# Patient Record
Sex: Female | Born: 1960 | Race: White | Hispanic: No | Marital: Married | Smoking: Former smoker
Health system: Southern US, Community
[De-identification: ages and names within clinical notes are randomized; demographics above are authoritative.]

## PROBLEM LIST (undated history)

## (undated) DIAGNOSIS — G40909 Epilepsy, unspecified, not intractable, without status epilepticus: Secondary | ICD-10-CM

## (undated) DIAGNOSIS — N83209 Unspecified ovarian cyst, unspecified side: Secondary | ICD-10-CM

## (undated) DIAGNOSIS — F419 Anxiety disorder, unspecified: Secondary | ICD-10-CM

## (undated) DIAGNOSIS — E039 Hypothyroidism, unspecified: Secondary | ICD-10-CM

## (undated) DIAGNOSIS — K743 Primary biliary cirrhosis: Secondary | ICD-10-CM

## (undated) HISTORY — DX: Epilepsy, unspecified, not intractable, without status epilepticus: G40.909

## (undated) HISTORY — PX: OVARY SURGERY: SHX727

---

## 2004-01-19 ENCOUNTER — Ambulatory Visit: Payer: Self-pay | Admitting: Obstetrics and Gynecology

## 2009-03-10 HISTORY — PX: BREAST CYST ASPIRATION: SHX578

## 2009-10-09 ENCOUNTER — Emergency Department: Payer: Self-pay | Admitting: Emergency Medicine

## 2009-11-09 ENCOUNTER — Ambulatory Visit: Payer: Self-pay | Admitting: Obstetrics and Gynecology

## 2010-03-10 HISTORY — PX: BREAST BIOPSY: SHX20

## 2010-04-17 ENCOUNTER — Ambulatory Visit: Payer: Self-pay | Admitting: Obstetrics and Gynecology

## 2010-07-30 ENCOUNTER — Ambulatory Visit: Payer: Self-pay | Admitting: Surgery

## 2010-08-15 ENCOUNTER — Ambulatory Visit: Payer: Self-pay | Admitting: Surgery

## 2010-08-16 LAB — PATHOLOGY REPORT

## 2010-12-02 ENCOUNTER — Ambulatory Visit: Payer: Self-pay | Admitting: Obstetrics and Gynecology

## 2011-01-15 ENCOUNTER — Ambulatory Visit: Payer: Self-pay | Admitting: Obstetrics and Gynecology

## 2011-12-03 ENCOUNTER — Ambulatory Visit: Payer: Self-pay | Admitting: Obstetrics and Gynecology

## 2012-05-31 ENCOUNTER — Ambulatory Visit: Payer: Self-pay | Admitting: Gastroenterology

## 2012-06-01 LAB — PATHOLOGY REPORT

## 2012-11-05 IMAGING — US ULTRASOUND LEFT BREAST
1 series · 17 of 21 positions shown · non-contrast
Comparison: none

REASON FOR EXAM: L 2 CYSTS  10 AND 12 OCLOCK
COMMENTS:

PROCEDURE:     US  - US BREAST LEFT  - April 17, 2010  [DATE]
RESULT:
COMPARISONS: 09-02-05, 09-07-06, 09-14-07, 10-20-08 from [REDACTED]; 11-09-09 from [HOSPITAL].

[Series 1: ultrasound left breast · 17 of 21 slices shown]
[im 1/21]
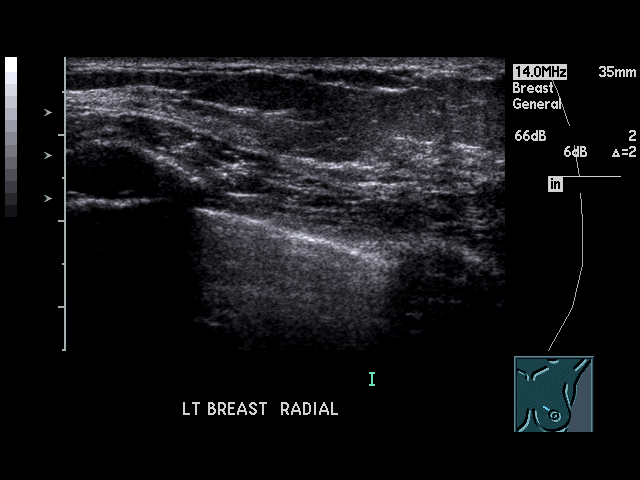
[im 2/21]
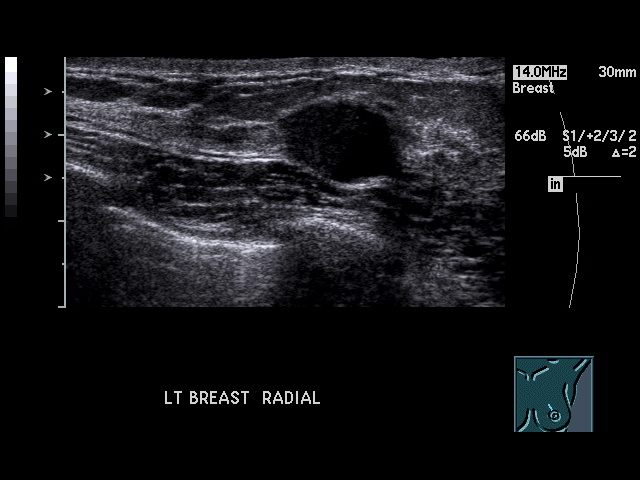
[im 4/21]
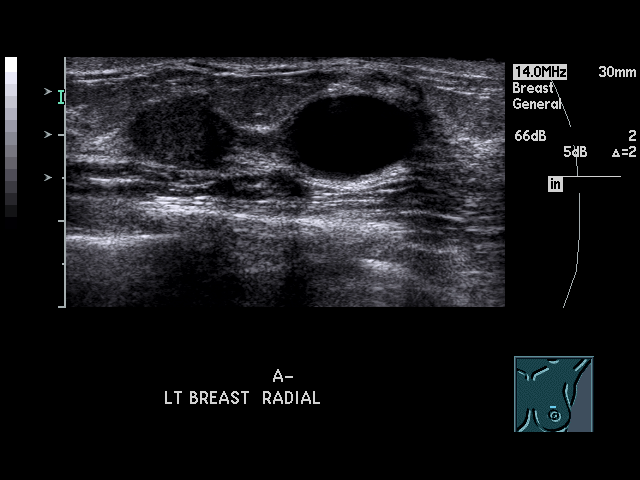
[im 5/21]
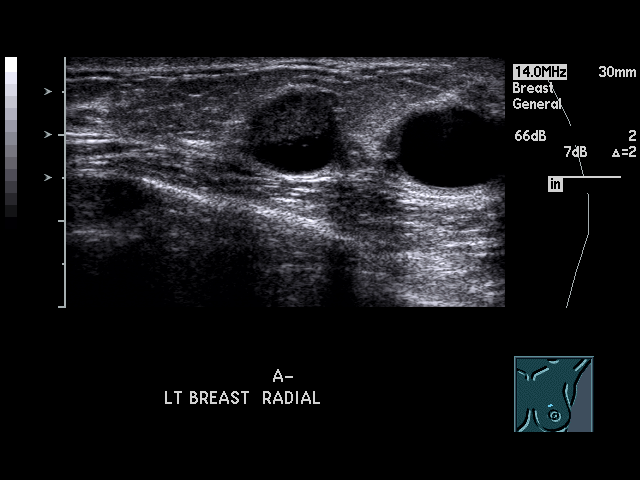
[im 6/21]
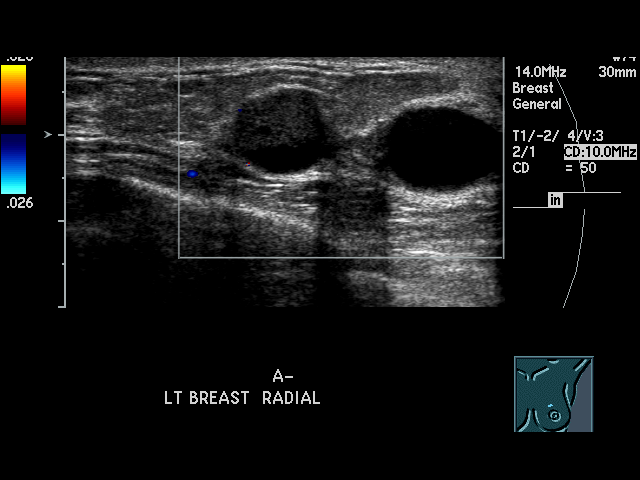
[im 7/21]
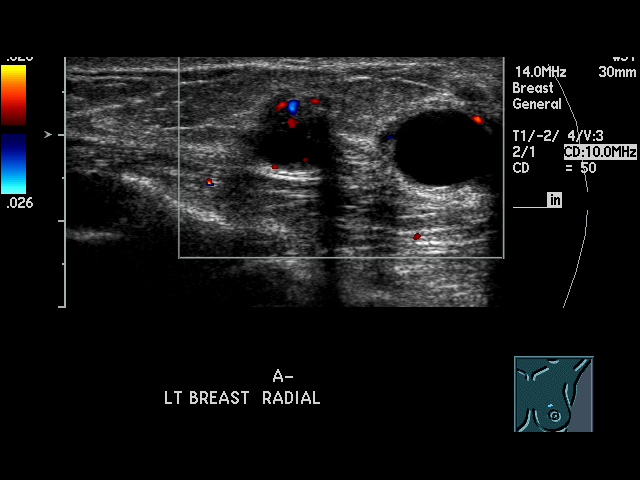
[im 9/21]
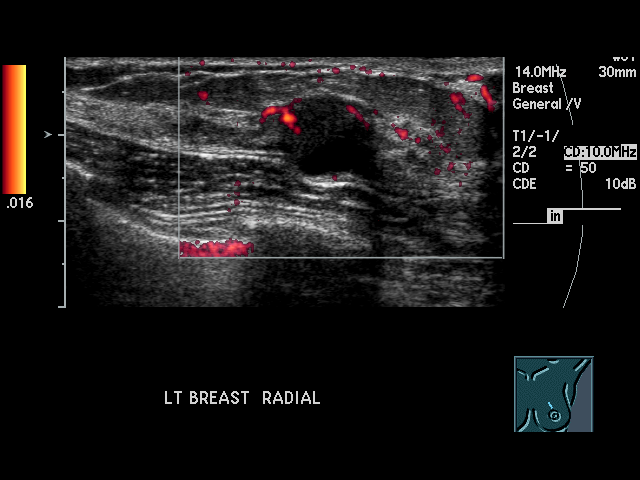
[im 10/21]
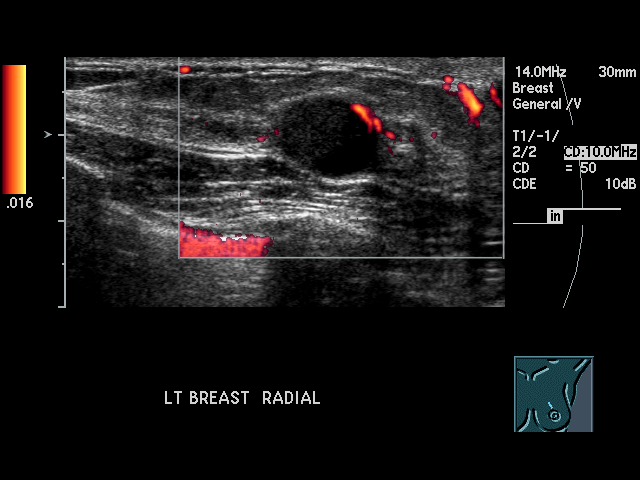
[im 11/21]
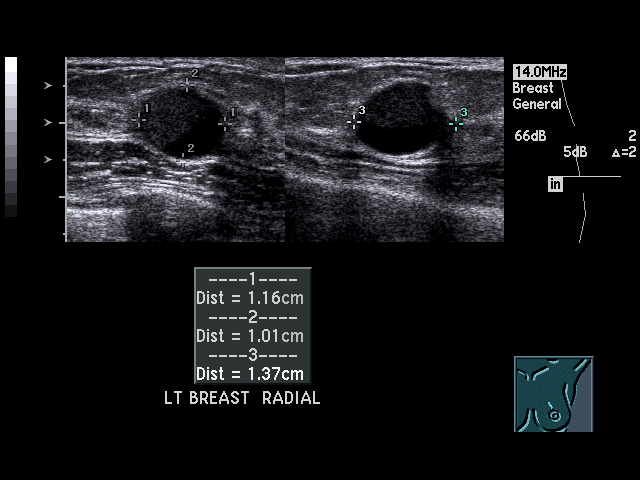
[im 12/21]
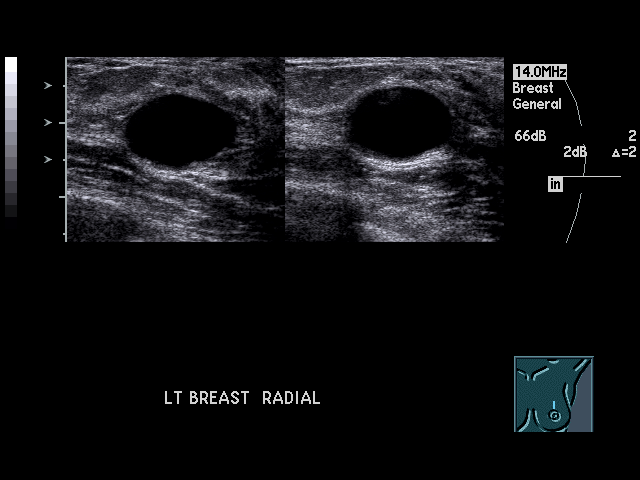
[im 13/21]
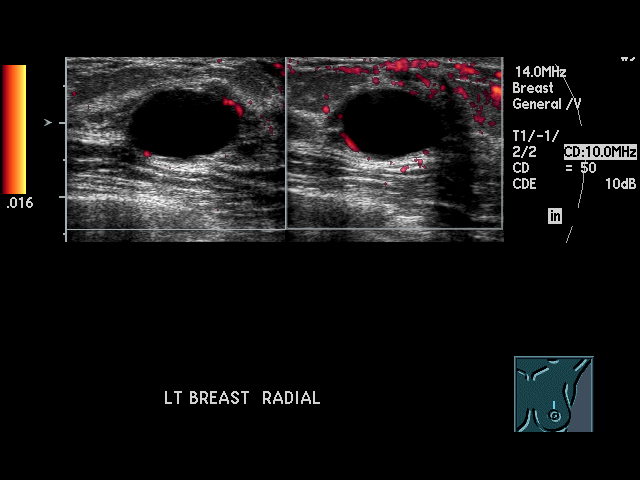
[im 15/21]
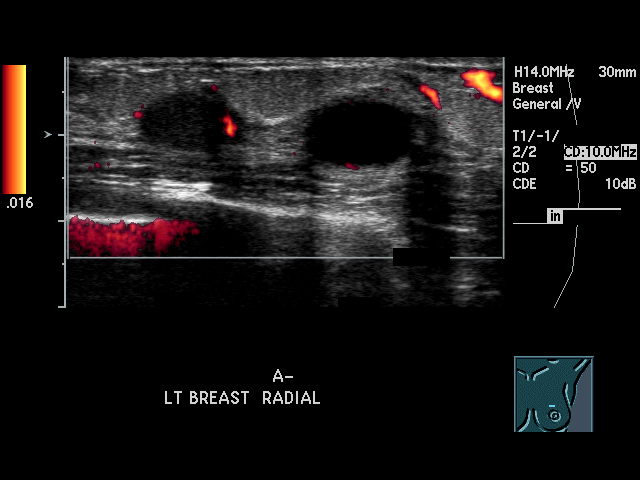
[im 16/21]
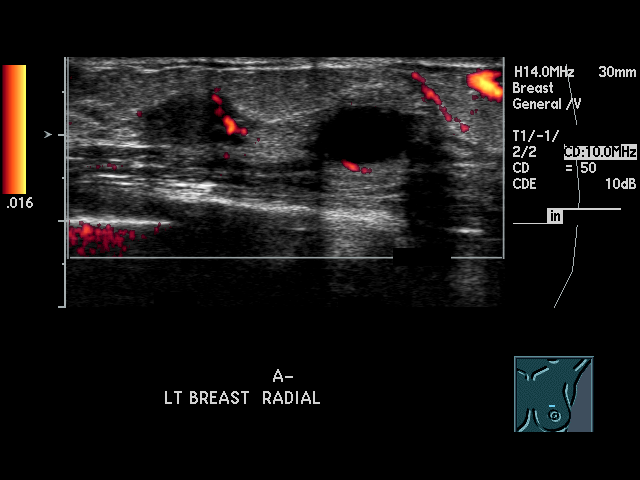
[im 17/21]
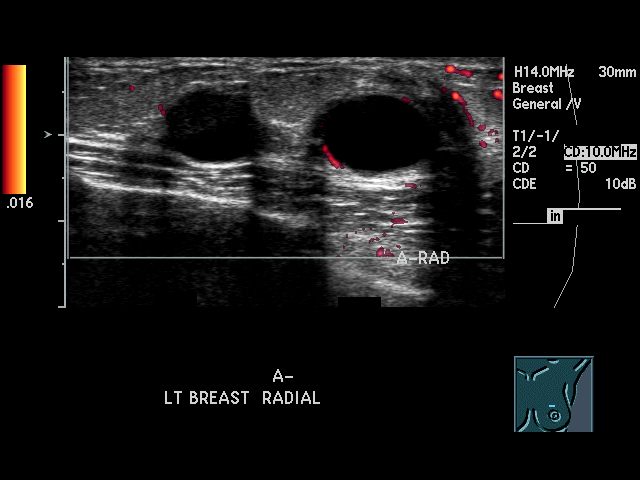
[im 18/21]
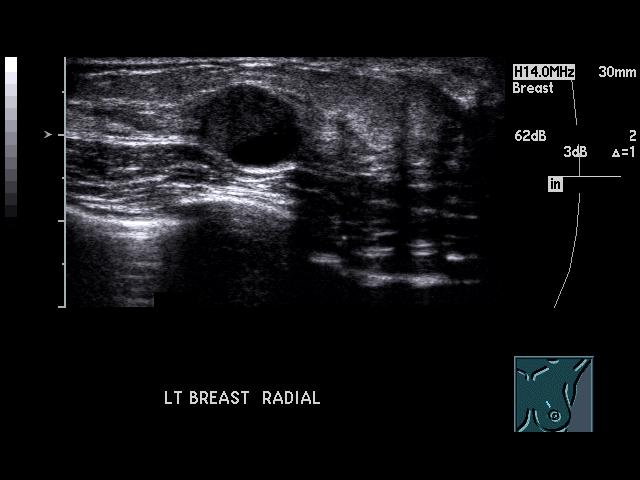
[im 20/21]
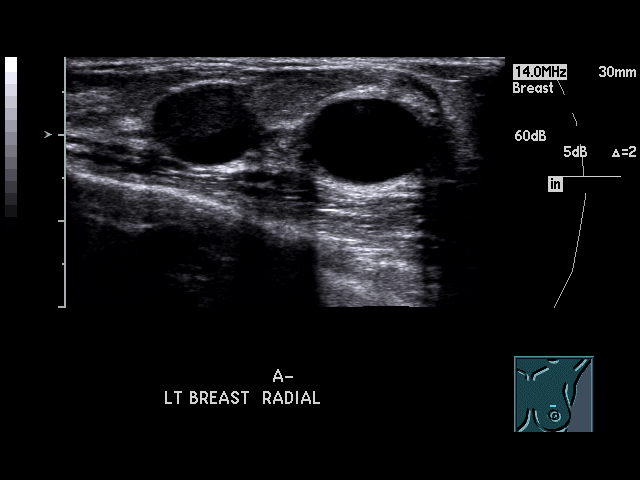
[im 21/21]
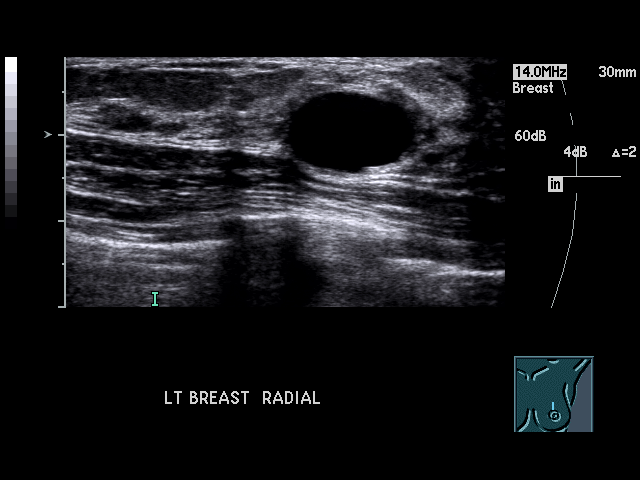

[17 of 21 positions shown; findings below may reference images not displayed]

FINDINGS: Unilateral left breast mammogram demonstrates an extremely dense
fibroglandular pattern limiting evaluation. There is no dominant mass,
architectural distortion or clusters of suspicious microcalcifications.

Further evaluation was performed with real time sonography of the left
breast from the 10 o'clock position to the 12 o'clock position.  At the 12
o'clock position there is a 1.5 x 1.1 x 1.4 cm anechoic mass with increased
through transmission and an imperceptible wall most consistent with a cyst.
Adjacently, at the 11 o'clock position there is a 1.2 x 1.0 x 1.4 cm complex
cystic mass with a mural nodule demonstrating a small amount of internal
Doppler flow.  There is no other solid or cystic mass.
IMPRESSION: 1.Complex cystic mass at the 11 o 'clock position in the left breast. Tissue
diagnosis is recommended.

BI-RADS-Category PE-Coderately Suspicious Abnormality

Lesion with an indeterminate suspicion of malignancy.

## 2012-12-03 ENCOUNTER — Ambulatory Visit: Payer: Self-pay | Admitting: Obstetrics and Gynecology

## 2013-12-19 ENCOUNTER — Ambulatory Visit: Payer: Self-pay | Admitting: Obstetrics and Gynecology

## 2014-08-15 ENCOUNTER — Other Ambulatory Visit: Payer: Self-pay | Admitting: Obstetrics and Gynecology

## 2014-08-15 DIAGNOSIS — N6315 Unspecified lump in the right breast, overlapping quadrants: Secondary | ICD-10-CM

## 2014-08-15 DIAGNOSIS — N6310 Unspecified lump in the right breast, unspecified quadrant: Principal | ICD-10-CM

## 2014-08-17 ENCOUNTER — Ambulatory Visit
Admission: RE | Admit: 2014-08-17 | Discharge: 2014-08-17 | Disposition: A | Discharge status: Home / Self Care | Payer: Commercial Managed Care - PPO | Source: Ambulatory Visit | Attending: Obstetrics and Gynecology | Admitting: Obstetrics and Gynecology

## 2014-08-17 DIAGNOSIS — N6310 Unspecified lump in the right breast, unspecified quadrant: Principal | ICD-10-CM

## 2014-08-17 DIAGNOSIS — N6315 Unspecified lump in the right breast, overlapping quadrants: Secondary | ICD-10-CM

## 2014-08-17 DIAGNOSIS — R928 Other abnormal and inconclusive findings on diagnostic imaging of breast: Secondary | ICD-10-CM | POA: Diagnosis not present

## 2014-08-17 DIAGNOSIS — N63 Unspecified lump in breast: Secondary | ICD-10-CM | POA: Diagnosis present

## 2014-08-18 ENCOUNTER — Ambulatory Visit: Payer: Self-pay

## 2014-08-18 ENCOUNTER — Other Ambulatory Visit: Payer: Self-pay

## 2014-08-22 ENCOUNTER — Ambulatory Visit: Payer: Self-pay

## 2014-10-13 ENCOUNTER — Other Ambulatory Visit: Payer: Self-pay | Admitting: Obstetrics and Gynecology

## 2014-10-13 ENCOUNTER — Ambulatory Visit
Admission: RE | Admit: 2014-10-13 | Discharge: 2014-10-13 | Disposition: A | Discharge status: Home / Self Care | Payer: Commercial Managed Care - PPO | Source: Ambulatory Visit | Attending: Obstetrics and Gynecology | Admitting: Obstetrics and Gynecology

## 2014-10-13 DIAGNOSIS — Z1231 Encounter for screening mammogram for malignant neoplasm of breast: Secondary | ICD-10-CM | POA: Insufficient documentation

## 2015-09-10 ENCOUNTER — Other Ambulatory Visit: Payer: Self-pay | Admitting: Obstetrics and Gynecology

## 2015-09-10 DIAGNOSIS — Z1231 Encounter for screening mammogram for malignant neoplasm of breast: Secondary | ICD-10-CM

## 2015-10-10 ENCOUNTER — Ambulatory Visit
Admission: RE | Admit: 2015-10-10 | Discharge: 2015-10-10 | Disposition: A | Discharge status: Home / Self Care | Payer: Commercial Managed Care - PPO | Source: Ambulatory Visit | Attending: Obstetrics and Gynecology | Admitting: Obstetrics and Gynecology

## 2015-10-10 ENCOUNTER — Other Ambulatory Visit: Payer: Self-pay | Admitting: Obstetrics and Gynecology

## 2015-10-10 DIAGNOSIS — Z1231 Encounter for screening mammogram for malignant neoplasm of breast: Secondary | ICD-10-CM

## 2015-10-15 ENCOUNTER — Ambulatory Visit: Payer: Commercial Managed Care - PPO

## 2016-09-26 ENCOUNTER — Other Ambulatory Visit: Payer: Self-pay | Admitting: Obstetrics and Gynecology

## 2016-09-26 DIAGNOSIS — Z1231 Encounter for screening mammogram for malignant neoplasm of breast: Secondary | ICD-10-CM

## 2016-10-02 ENCOUNTER — Emergency Department: Payer: Commercial Managed Care - PPO

## 2016-10-02 ENCOUNTER — Encounter: Payer: Self-pay | Admitting: Emergency Medicine

## 2016-10-02 ENCOUNTER — Emergency Department
Admission: EM | Admit: 2016-10-02 | Discharge: 2016-10-02 | Disposition: A | Discharge status: Home / Self Care | Payer: Commercial Managed Care - PPO | Attending: Emergency Medicine | Admitting: Emergency Medicine

## 2016-10-02 DIAGNOSIS — R569 Unspecified convulsions: Secondary | ICD-10-CM | POA: Insufficient documentation

## 2016-10-02 DIAGNOSIS — R410 Disorientation, unspecified: Secondary | ICD-10-CM | POA: Diagnosis present

## 2016-10-02 DIAGNOSIS — Z87891 Personal history of nicotine dependence: Secondary | ICD-10-CM | POA: Insufficient documentation

## 2016-10-02 LAB — CBC
HCT: 38.8 % (ref 35.0–47.0)
Hemoglobin: 13.4 g/dL (ref 12.0–16.0)
MCH: 31.3 pg (ref 26.0–34.0)
MCHC: 34.6 g/dL (ref 32.0–36.0)
MCV: 90.7 fL (ref 80.0–100.0)
Platelets: 256 10*3/uL (ref 150–440)
RBC: 4.28 MIL/uL (ref 3.80–5.20)
RDW: 13.7 % (ref 11.5–14.5)
WBC: 11.4 10*3/uL — ABNORMAL HIGH (ref 3.6–11.0)

## 2016-10-02 LAB — HEPATIC FUNCTION PANEL
ALT: 91 U/L — AB (ref 14–54)
AST: 53 U/L — ABNORMAL HIGH (ref 15–41)
Albumin: 4.2 g/dL (ref 3.5–5.0)
Alkaline Phosphatase: 417 U/L — ABNORMAL HIGH (ref 38–126)
BILIRUBIN TOTAL: 0.8 mg/dL (ref 0.3–1.2)
Bilirubin, Direct: 0.1 mg/dL (ref 0.1–0.5)
Indirect Bilirubin: 0.7 mg/dL (ref 0.3–0.9)
Total Protein: 7.9 g/dL (ref 6.5–8.1)

## 2016-10-02 LAB — HCG, QUANTITATIVE, PREGNANCY: hCG, Beta Chain, Quant, S: 1 m[IU]/mL (ref <5)

## 2016-10-02 LAB — BASIC METABOLIC PANEL
Anion gap: 11 (ref 5–15)
BUN: 11 mg/dL (ref 6–20)
CHLORIDE: 103 mmol/L (ref 101–111)
CO2: 25 mmol/L (ref 22–32)
Calcium: 9.4 mg/dL (ref 8.9–10.3)
Creatinine, Ser: 0.84 mg/dL (ref 0.44–1.00)
GFR calc non Af Amer: >60 mL/min (ref >60)
Glucose, Bld: 105 mg/dL — ABNORMAL HIGH (ref 65–99)
Potassium: 3.5 mmol/L (ref 3.5–5.1)
Sodium: 139 mmol/L (ref 135–145)

## 2016-10-02 LAB — ETHANOL: Alcohol, Ethyl (B): <5 mg/dL (ref <5)

## 2016-10-02 NOTE — ED Triage Notes (Signed)
Pt via pov from work. She began to fall on the way to the triage room. Pt was prevented from falling and was unresponsive for a few seconds. When pt was brought to ED room 3, she began to fall again. During triage, she became unresponsive with eyes open x 3, for a few seconds each time. Pt denies pain, states she is "just very tired."

## 2016-10-02 NOTE — Discharge Instructions (Signed)
Fortunately today your CT scan in your blood work were reassuring. It is possible that you have had seizures but according to our neurology consultant he does not recommend that you begin seizure medicine today. Please keep your follow-up with your neurologist tomorrow as scheduled and return to the emergency department for any concerns.  It was a pleasure to take care of you today, and thank you for coming to our emergency department.  If you have any questions or concerns before leaving please ask the nurse to grab me and I'm more than happy to go through your aftercare instructions again.  If you were prescribed any opioid pain medication today such as Norco, Vicodin, Percocet, morphine, hydrocodone, or oxycodone please make sure you do not drive when you are taking this medication as it can alter your ability to drive safely.  If you have any concerns once you are home that you are not improving or are in fact getting worse before you can make it to your follow-up appointment, please do not hesitate to call 911 and come back for further evaluation.  Darel Hong MD  Results for orders placed or performed during the hospital encounter of 36/64/40  Basic metabolic panel  Result Value Ref Range   Sodium 139 135 - 145 mmol/L   Potassium 3.5 3.5 - 5.1 mmol/L   Chloride 103 101 - 111 mmol/L   CO2 25 22 - 32 mmol/L   Glucose, Bld 105 (H) 65 - 99 mg/dL   BUN 11 6 - 20 mg/dL   Creatinine, Ser 0.84 0.44 - 1.00 mg/dL   Calcium 9.4 8.9 - 10.3 mg/dL   GFR calc non Af Amer >60 >60 mL/min   GFR calc Af Amer >60 >60 mL/min   Anion gap 11 5 - 15  CBC  Result Value Ref Range   WBC 11.4 (H) 3.6 - 11.0 K/uL   RBC 4.28 3.80 - 5.20 MIL/uL   Hemoglobin 13.4 12.0 - 16.0 g/dL   HCT 38.8 35.0 - 47.0 %   MCV 90.7 80.0 - 100.0 fL   MCH 31.3 26.0 - 34.0 pg   MCHC 34.6 32.0 - 36.0 g/dL   RDW 13.7 11.5 - 14.5 %   Platelets 256 150 - 440 K/uL  Hepatic function panel  Result Value Ref Range   Total Protein  7.9 6.5 - 8.1 g/dL   Albumin 4.2 3.5 - 5.0 g/dL   AST 53 (H) 15 - 41 U/L   ALT 91 (H) 14 - 54 U/L   Alkaline Phosphatase 417 (H) 38 - 126 U/L   Total Bilirubin 0.8 0.3 - 1.2 mg/dL   Bilirubin, Direct 0.1 0.1 - 0.5 mg/dL   Indirect Bilirubin 0.7 0.3 - 0.9 mg/dL  hCG, quantitative, pregnancy  Result Value Ref Range   hCG, Beta Chain, Quant, S 1 <5 mIU/mL  Ethanol  Result Value Ref Range   Alcohol, Ethyl (B) <5 <5 mg/dL   Ct Head Wo Contrast  Result Date: 10/02/2016 CLINICAL DATA:  New onset seizure EXAM: CT HEAD WITHOUT CONTRAST TECHNIQUE: Contiguous axial images were obtained from the base of the skull through the vertex without intravenous contrast. COMPARISON:  None. FINDINGS: Brain: No evidence of acute infarction, hemorrhage, hydrocephalus, extra-axial collection or mass lesion/mass effect. Vascular: No hyperdense vessel or unexpected calcification. Skull: Negative Sinuses/Orbits: Negative Other: None IMPRESSION: Negative CT head Electronically Signed   By: Franchot Gallo M.D.   On: 10/02/2016 13:24

## 2016-10-02 NOTE — ED Provider Notes (Signed)
[facility] Emergency Department Provider Note  ____________________________________________   First MD Initiated Contact with Patient 10/02/16 1259     (approximate)  I have reviewed the triage vital signs and the nursing notes.   HISTORY  Chief Complaint Loss of Consciousness    HPI Margaret [name] is a 56 y.o. female who self presents to the emergency department with increasing frequency of episodes of confusion. It all began about a year ago when she's had them nearly every day since. She said that once to twice a day she will have an episode where she "loses time". When other people are around her this issue becomes unresponsive and then when she comes back to reality she does not remember what is happening. She has never fallen. She has no pain. No chest pain or shortness of breath. No headache. She notes no palpitations. She has never had any weakness or numbness. She's never lost bowel or bladder continence. She has follow-up scheduled with neurology tomorrow however today she had 3 events and she wanted to come to the emergency department for sooner evaluation.   History reviewed. No pertinent past medical history.  There are no active problems to display for this patient.   Past Surgical History:  Procedure Laterality Date  . BREAST BIOPSY Left 2012   NEG  . BREAST CYST ASPIRATION Left 2011    Prior to Admission medications   Medication Sig Start Date End Date Taking? Authorizing Provider  SYNTHROID 88 MCG tablet Take 88 mcg by mouth daily. 06/30/16  Yes [provider]    Allergies Patient has no known allergies.  Family History  Problem Relation Age of Onset  . Breast cancer Neg Hx     Social History Social History  Substance Use Topics  . Smoking status: Former Research scientist (life sciences)  . Smokeless tobacco: Never Used  . Alcohol use 2.4 oz/week    4 Glasses of wine per week    Review of Systems Constitutional: No  fever/chills Eyes: No visual changes. ENT: No sore throat. Cardiovascular: Denies chest pain. Respiratory: Denies shortness of breath. Gastrointestinal: No abdominal pain.  No nausea, no vomiting.  No diarrhea.  No constipation. Genitourinary: Negative for dysuria. Musculoskeletal: Negative for back pain. Skin: Negative for rash. Neurological: Negative for headaches, focal weakness or numbness.   ____________________________________________   PHYSICAL EXAM:  VITAL SIGNS: ED Triage Vitals [10/02/16 1258]  Enc Vitals Group     BP (!) 160/84     Pulse Rate 99     Resp 15     Temp 99.1 F (37.3 C)     Temp Source Oral     SpO2 97 %     Weight 121 lb (54.9 kg)     Height 5\' 2"  (1.575 m)     Head Circumference      Peak Flow      Pain Score      Pain Loc      Pain Edu?      Excl. in [place]?     Constitutional: Alert and oriented 4 pleasant cooperative speaks full clear sentences Eyes: PERRL EOMI. Head: Atraumatic. Nose: No congestion/rhinnorhea. Mouth/Throat: No trismus Neck: No stridor.   Cardiovascular: Normal rate, regular rhythm. Grossly normal heart sounds.  Good peripheral circulation. Respiratory: Normal respiratory effort.  No retractions. Lungs CTAB and moving good air Gastrointestinal: Soft nontender Musculoskeletal: No lower extremity edema   Neurologic:  Normal speech and language.  No pronator drift 5 out of 5  grips biceps triceps hip flexion and hip extension plantar flexion dorsiflexion sensation intact to light touch throughout ambulance stating he Skin:  Skin is warm, dry and intact. No rash noted. Psychiatric: Mood and affect are normal. Speech and behavior are normal.    ____________________________________________   DIFFERENTIAL includes but not limited to  Cardiac arrhythmia, conversion disorder, absence seizure   LABS (all labs ordered are listed, but only abnormal results are displayed)  Labs Reviewed  BASIC METABOLIC PANEL - Abnormal;  Notable for the following:       Result Value   Glucose, Bld 105 (*)    All other components within normal limits  CBC - Abnormal; Notable for the following:    WBC 11.4 (*)    All other components within normal limits  HEPATIC FUNCTION PANEL - Abnormal; Notable for the following:    AST 53 (*)    ALT 91 (*)    Alkaline Phosphatase 417 (*)    All other components within normal limits  HCG, QUANTITATIVE, PREGNANCY  ETHANOL  CBG MONITORING, ED    Not pregnant and blood work unremarkable __________________________________________  EKG  ED ECG REPORT I, Darel Hong, the attending physician, personally viewed and interpreted this ECG.  Date: 10/02/2016 Rate: 97 Rhythm: normal sinus rhythm QRS Axis: normal Intervals: normal ST/T Wave abnormalities: normal Narrative Interpretation: unremarkable  ____________________________________________  RADIOLOGY  Head CT with no acute disease ____________________________________________   PROCEDURES  Procedure(s) performed: no  Procedures  Critical Care performed: no  Observation: no ____________________________________________   INITIAL IMPRESSION / ASSESSMENT AND PLAN / ED COURSE  Pertinent labs & imaging results that were available during my care of the patient were reviewed by me and considered in my medical decision making (see chart for details).  On arrival the patient is very well-appearing with a normal neurological exam although while talking to me she had a brief episode where she stared off into the distance and was unresponsive lasting about 10-15 seconds. She was subsequently amnestic to the event. This occurred while she was on monitor she had no arrhythmia. Concern for absence seizure versus conversion disorder. Labs and head CT are pending.     ----------------------------------------- 1:34 PM on 10/02/2016 -----------------------------------------  Fortunately the patient's CT scan is reassuring.  She already has neurology follow-up scheduled tomorrow with Dr. Melrose Nakayama, however given the increasing severity of her symptoms and we'll touch base with her specialist on call now for possible antiepileptic recommendations. Regardless I anticipate that the patient will likely be able to go home today. ____________________________________________  I discussed the case with the specialist on call who evaluated the patient and feels that these may not actually be seizures but does recommend an outpatient EEG and MRI. He recommends against initiation of antiepileptic medications. At this point the patient is medically stable for outpatient management.  FINAL CLINICAL IMPRESSION(S) / ED DIAGNOSES  Final diagnoses:  Seizure ([place])      NEW MEDICATIONS STARTED DURING THIS VISIT:  Discharge Medication List as of 10/02/2016  3:38 PM       Note:  This document was prepared using Dragon voice recognition software and may include unintentional dictation errors.     Darel Hong, MD 10/03/16 2158

## 2016-10-03 ENCOUNTER — Observation Stay
Admission: EM | Admit: 2016-10-03 | Discharge: 2016-10-05 | Disposition: A | Discharge status: Home / Self Care | Payer: Commercial Managed Care - PPO | Attending: Internal Medicine | Admitting: Internal Medicine

## 2016-10-03 ENCOUNTER — Encounter: Payer: Self-pay | Admitting: Emergency Medicine

## 2016-10-03 ENCOUNTER — Emergency Department: Payer: Commercial Managed Care - PPO

## 2016-10-03 DIAGNOSIS — G40A09 Absence epileptic syndrome, not intractable, without status epilepticus: Secondary | ICD-10-CM | POA: Diagnosis present

## 2016-10-03 DIAGNOSIS — R079 Chest pain, unspecified: Secondary | ICD-10-CM | POA: Diagnosis not present

## 2016-10-03 DIAGNOSIS — Z87891 Personal history of nicotine dependence: Secondary | ICD-10-CM | POA: Diagnosis not present

## 2016-10-03 DIAGNOSIS — F419 Anxiety disorder, unspecified: Secondary | ICD-10-CM | POA: Diagnosis not present

## 2016-10-03 DIAGNOSIS — R569 Unspecified convulsions: Secondary | ICD-10-CM | POA: Insufficient documentation

## 2016-10-03 DIAGNOSIS — R55 Syncope and collapse: Secondary | ICD-10-CM

## 2016-10-03 DIAGNOSIS — E039 Hypothyroidism, unspecified: Secondary | ICD-10-CM | POA: Diagnosis not present

## 2016-10-03 DIAGNOSIS — R930 Abnormal findings on diagnostic imaging of skull and head, not elsewhere classified: Secondary | ICD-10-CM | POA: Diagnosis not present

## 2016-10-03 HISTORY — DX: Hypothyroidism, unspecified: E03.9

## 2016-10-03 HISTORY — DX: Unspecified ovarian cyst, unspecified side: N83.209

## 2016-10-03 LAB — COMPREHENSIVE METABOLIC PANEL
ALT: 76 U/L — AB (ref 14–54)
ANION GAP: 9 (ref 5–15)
AST: 49 U/L — ABNORMAL HIGH (ref 15–41)
Albumin: 4.2 g/dL (ref 3.5–5.0)
Alkaline Phosphatase: 419 U/L — ABNORMAL HIGH (ref 38–126)
BUN: 16 mg/dL (ref 6–20)
CALCIUM: 9.7 mg/dL (ref 8.9–10.3)
CHLORIDE: 102 mmol/L (ref 101–111)
CO2: 27 mmol/L (ref 22–32)
CREATININE: 0.99 mg/dL (ref 0.44–1.00)
Glucose, Bld: 139 mg/dL — ABNORMAL HIGH (ref 65–99)
Potassium: 3.7 mmol/L (ref 3.5–5.1)
SODIUM: 138 mmol/L (ref 135–145)
Total Bilirubin: 0.6 mg/dL (ref 0.3–1.2)
Total Protein: 8.2 g/dL — ABNORMAL HIGH (ref 6.5–8.1)

## 2016-10-03 LAB — CBC WITH DIFFERENTIAL/PLATELET
BASOS PCT: 2 %
Basophils Absolute: 0.2 10*3/uL — ABNORMAL HIGH (ref 0–0.1)
EOS ABS: 0.5 10*3/uL (ref 0–0.7)
EOS PCT: 3 %
HCT: 41.8 % (ref 35.0–47.0)
Hemoglobin: 14.1 g/dL (ref 12.0–16.0)
LYMPHS ABS: 4.2 10*3/uL — AB (ref 1.0–3.6)
Lymphocytes Relative: 30 %
MCH: 30.7 pg (ref 26.0–34.0)
MCHC: 33.8 g/dL (ref 32.0–36.0)
MCV: 90.9 fL (ref 80.0–100.0)
MONOS PCT: 8 %
Monocytes Absolute: 1.1 10*3/uL — ABNORMAL HIGH (ref 0.2–0.9)
Neutro Abs: 7.8 10*3/uL — ABNORMAL HIGH (ref 1.4–6.5)
Neutrophils Relative %: 57 %
Platelets: 288 10*3/uL (ref 150–440)
RBC: 4.6 MIL/uL (ref 3.80–5.20)
RDW: 13.7 % (ref 11.5–14.5)
WBC: 13.8 10*3/uL — AB (ref 3.6–11.0)

## 2016-10-03 LAB — PREGNANCY, URINE: Preg Test, Ur: NEGATIVE

## 2016-10-03 LAB — URINALYSIS, COMPLETE (UACMP) WITH MICROSCOPIC
Bacteria, UA: NONE SEEN
Bilirubin Urine: NEGATIVE
GLUCOSE, UA: NEGATIVE mg/dL
Ketones, ur: NEGATIVE mg/dL
Leukocytes, UA: NEGATIVE
NITRITE: NEGATIVE
PH: 6 (ref 5.0–8.0)
PROTEIN: NEGATIVE mg/dL
SPECIFIC GRAVITY, URINE: 1.017 (ref 1.005–1.030)

## 2016-10-03 LAB — BRAIN NATRIURETIC PEPTIDE: B NATRIURETIC PEPTIDE 5: 14 pg/mL (ref 0.0–100.0)

## 2016-10-03 LAB — TROPONIN I

## 2016-10-03 LAB — LACTIC ACID, PLASMA: Lactic Acid, Venous: 1.3 mmol/L (ref 0.5–1.9)

## 2016-10-03 LAB — LIPASE, BLOOD: LIPASE: 40 U/L (ref 11–51)

## 2016-10-03 MED ORDER — ASPIRIN 81 MG PO CHEW
324.0000 mg | CHEWABLE_TABLET | Freq: Once | ORAL | Status: AC
  Administered 2016-10-03: 324 mg via ORAL
  Filled 2016-10-03: qty 4

## 2016-10-03 NOTE — ED Notes (Signed)
Pt. States feeling "knot" in chest today about 21:00 tonight.  Pt. States ongoing issue for the past year.  Pt. States "I usually take some deep breaths and it goes away".  Pt. States taking an anti-axiety medication today around noon.  Pt. States also taking melatonin before going to bed tonight.

## 2016-10-03 NOTE — ED Notes (Signed)
Pt. Was brought back to room #26 from triage.  Pt. Had a syncope like episode on transport from triage to Room #26.  Pt. Alert and oriented when moved from wheelchair to bed.

## 2016-10-03 NOTE — ED Notes (Addendum)
Shout heard from lobby doors, man with pt seen holding pt up after apparent syncopal episode, report pt c/o chest pain and wearing halter monitor, pt assisted to wheelchair and taken to rm 26.  Man states pt did not fall to ground, he had caught her

## 2016-10-03 NOTE — ED Provider Notes (Signed)
[facility] Emergency Department Provider Note   ____________________________________________   First MD Initiated Contact with Patient 10/03/16 2226     (approximate)  I have reviewed the triage vital signs and the nursing notes.   HISTORY  Chief Complaint Chest Pain   HPI Margaret [name] is a 56 y.o. female who was seen here yesterday for episodes of forgetfulness and forgetting where she was. She went to see neurology today who did not think these were seizures. She was put on a Holter monitor. An hour and a half prior to her arrival she began to feel bad she began to have not feeling in her chest feeling like there was a knot in her chest perhaps that should say she got somewhat nauseated and short of breath. Began to feel very sick and lightheaded. She came in to the hospital on the way here, very weak and lightheaded and actually passed out briefly. She is still having some symptoms of shortness of breath tightness as I understand it in her chest.   History reviewed. No pertinent past medical history.  There are no active problems to display for this patient.   Past Surgical History:  Procedure Laterality Date  . BREAST BIOPSY Left 2012   NEG  . BREAST CYST ASPIRATION Left 2011    Prior to Admission medications   Medication Sig Start Date End Date Taking? Authorizing Provider  SYNTHROID 88 MCG tablet Take 88 mcg by mouth daily. 06/30/16   [provider]    Allergies Patient has no known allergies.  Family History  Problem Relation Age of Onset  . Breast cancer Neg Hx     Social History Social History  Substance Use Topics  . Smoking status: Former Research scientist (life sciences)  . Smokeless tobacco: Never Used  . Alcohol use 2.4 oz/week    4 Glasses of wine per week    Review of Systems  Constitutional: No fever/chills Eyes: No visual changes. ENT: No sore throat. Cardiovascular: See history of present illness. Respiratory: See history of  present illness Gastrointestinal: No abdominal pain.   nausea, no vomiting.  No diarrhea.  No constipation. Genitourinary: Negative for dysuria. Musculoskeletal: Negative for back pain. Skin: Negative for rash. Neurological: Negative for headaches, focal weakness or numbness.   ____________________________________________   PHYSICAL EXAM:  VITAL SIGNS: ED Triage Vitals  Enc Vitals Group     BP 10/03/16 2236 (!) 147/92     Pulse Rate 10/03/16 2231 90     Resp 10/03/16 2231 20     Temp 10/03/16 2236 98.1 F (36.7 C)     Temp Source 10/03/16 2236 Oral     SpO2 10/03/16 2236 96 %     Weight 10/03/16 2227 121 lb (54.9 kg)     Height 10/03/16 2227 5\' 2"  (1.575 m)     Head Circumference --      Peak Flow --      Pain Score 10/03/16 2226 0     Pain Loc --      Pain Edu? --      Excl. in [place]? --     Constitutional: Alert and oriented. Well appearing and in no acute distress. Eyes: Conjunctivae are normal. PERRL. EOMI. Head: Atraumatic. Nose: No congestion/rhinnorhea. Mouth/Throat: Mucous membranes are moist.  Oropharynx non-erythematous. Neck: No stridor.  Cardiovascular: Normal rate, regular rhythm. Grossly normal heart sounds.  Good peripheral circulation. Respiratory: Normal respiratory effort.  No retractions. Lungs CTAB. Gastrointestinal: Soft and nontender. No distention. No  abdominal bruits. No CVA tenderness. Musculoskeletal: No lower extremity tenderness nor edema.  No joint effusions. Neurologic:  Normal speech and language. No gross focal neurologic deficits are appreciated.  Skin:  Skin is warm, dry and intact. No rash noted. Psychiatric: Mood and affect are normal. Speech and behavior are normal.  ____________________________________________   LABS (all labs ordered are listed, but only abnormal results are displayed)  Labs Reviewed  COMPREHENSIVE METABOLIC PANEL - Abnormal; Notable for the following:       Result Value   Glucose, Bld 139 (*)    Total  Protein 8.2 (*)    AST 49 (*)    ALT 76 (*)    Alkaline Phosphatase 419 (*)    All other components within normal limits  CBC WITH DIFFERENTIAL/PLATELET - Abnormal; Notable for the following:    WBC 13.8 (*)    Neutro Abs 7.8 (*)    Lymphs Abs 4.2 (*)    Monocytes Absolute 1.1 (*)    Basophils Absolute 0.2 (*)    All other components within normal limits  LIPASE, BLOOD  TROPONIN I  LACTIC ACID, PLASMA  BRAIN NATRIURETIC PEPTIDE  LACTIC ACID, PLASMA  URINALYSIS, COMPLETE (UACMP) WITH MICROSCOPIC  URINE DRUG SCREEN, QUALITATIVE ([facility] ONLY)  PREGNANCY, URINE   ____________________________________________  EKG  EKG read and interpreted by me shows normal sinus rhythm rate of 80 she has 2 PVCs otherwise nonspecific ST-T wave changes ____________________________________________  RADIOLOGY  Dg Chest Portable 1 View  Result Date: 10/03/2016 CLINICAL DATA:  Chest pain.  Seen yesterday for the same thing. EXAM: PORTABLE CHEST 1 VIEW COMPARISON:  None. FINDINGS: Moderate hyperinflation. The lungs are clear. The pulmonary vasculature is normal. Heart size is normal. Hilar and mediastinal contours are unremarkable. No pleural effusions. No pneumothorax. IMPRESSION: No active disease. Electronically Signed   By: Andreas Newport M.D.   On: 10/03/2016 23:02    ____________________________________________   PROCEDURES  Procedure(s) performed:   Procedures  Critical Care performed:   ____________________________________________   INITIAL IMPRESSION / ASSESSMENT AND PLAN / ED COURSE  Pertinent labs & imaging results that were available during my care of the patient were reviewed by me and considered in my medical decision making (see chart for details).   Patient's labs are normal still she had chest pain shortness of breath and syncope. We will put her in the hospital overnight.     ____________________________________________   FINAL CLINICAL IMPRESSION(S) / ED  DIAGNOSES  Final diagnoses:  Chest pain, unspecified type  Syncope, unspecified syncope type      NEW MEDICATIONS STARTED DURING THIS VISIT:  New Prescriptions   No medications on file     Note:  This document was prepared using Dragon voice recognition software and may include unintentional dictation errors.    Nena Polio, MD 10/03/16 [phone]

## 2016-10-03 NOTE — ED Triage Notes (Signed)
Pt arrived POV with c/o chest "knot". Pt was seen here yesterday for same. Pt is wearing halter monitor at this time. Pt is responsive, answering questions, and in NAD at this time.

## 2016-10-04 ENCOUNTER — Observation Stay: Payer: Commercial Managed Care - PPO

## 2016-10-04 ENCOUNTER — Observation Stay (HOSPITAL_BASED_OUTPATIENT_CLINIC_OR_DEPARTMENT_OTHER)
Admit: 2016-10-04 | Discharge: 2016-10-04 | Disposition: A | Discharge status: Home / Self Care | Payer: Commercial Managed Care - PPO | Attending: Internal Medicine | Admitting: Internal Medicine

## 2016-10-04 DIAGNOSIS — R410 Disorientation, unspecified: Secondary | ICD-10-CM | POA: Diagnosis not present

## 2016-10-04 DIAGNOSIS — R079 Chest pain, unspecified: Secondary | ICD-10-CM

## 2016-10-04 DIAGNOSIS — F419 Anxiety disorder, unspecified: Secondary | ICD-10-CM | POA: Diagnosis present

## 2016-10-04 LAB — ECHOCARDIOGRAM COMPLETE
Height: 62 in
Weight: 1936 oz

## 2016-10-04 LAB — BASIC METABOLIC PANEL
ANION GAP: 6 (ref 5–15)
BUN: 15 mg/dL (ref 6–20)
CHLORIDE: 105 mmol/L (ref 101–111)
CO2: 29 mmol/L (ref 22–32)
CREATININE: 0.83 mg/dL (ref 0.44–1.00)
Calcium: 9.4 mg/dL (ref 8.9–10.3)
GFR calc non Af Amer: >60 mL/min (ref >60)
Glucose, Bld: 110 mg/dL — ABNORMAL HIGH (ref 65–99)
POTASSIUM: 4.1 mmol/L (ref 3.5–5.1)
SODIUM: 140 mmol/L (ref 135–145)

## 2016-10-04 LAB — URINE DRUG SCREEN, QUALITATIVE (ARMC ONLY)
AMPHETAMINES, UR SCREEN: NOT DETECTED
Barbiturates, Ur Screen: NOT DETECTED
Benzodiazepine, Ur Scrn: NOT DETECTED
Cannabinoid 50 Ng, Ur ~~LOC~~: NOT DETECTED
Cocaine Metabolite,Ur ~~LOC~~: NOT DETECTED
MDMA (ECSTASY) UR SCREEN: NOT DETECTED
METHADONE SCREEN, URINE: NOT DETECTED
OPIATE, UR SCREEN: NOT DETECTED
PHENCYCLIDINE (PCP) UR S: NOT DETECTED
Tricyclic, Ur Screen: NOT DETECTED

## 2016-10-04 LAB — TSH: TSH: 1.514 u[IU]/mL (ref 0.350–4.500)

## 2016-10-04 LAB — CBC
HCT: 39.6 % (ref 35.0–47.0)
HEMOGLOBIN: 13.7 g/dL (ref 12.0–16.0)
MCH: 32.1 pg (ref 26.0–34.0)
MCHC: 34.6 g/dL (ref 32.0–36.0)
MCV: 92.9 fL (ref 80.0–100.0)
Platelets: 238 10*3/uL (ref 150–440)
RBC: 4.27 MIL/uL (ref 3.80–5.20)
RDW: 13.6 % (ref 11.5–14.5)
WBC: 12 10*3/uL — AB (ref 3.6–11.0)

## 2016-10-04 LAB — TROPONIN I
Troponin I: <0.03 ng/mL (ref <0.03)
Troponin I: <0.03 ng/mL (ref <0.03)

## 2016-10-04 MED ORDER — ACETAMINOPHEN 650 MG RE SUPP: 650.0000 mg | Freq: Four times a day (QID) | RECTAL | Status: DC | PRN

## 2016-10-04 MED ORDER — ALPRAZOLAM 0.25 MG PO TABS: 0.2500 mg | ORAL_TABLET | Freq: Every day | ORAL | Status: DC | PRN

## 2016-10-04 MED ORDER — LEVOTHYROXINE SODIUM 88 MCG PO TABS
88.0000 ug | ORAL_TABLET | Freq: Every day | ORAL | Status: DC
  Administered 2016-10-04 – 2016-10-05 (×2): 88 ug via ORAL
  Filled 2016-10-04 (×2): qty 1

## 2016-10-04 MED ORDER — ESCITALOPRAM OXALATE 10 MG PO TABS
10.0000 mg | ORAL_TABLET | Freq: Every day | ORAL | Status: DC
  Administered 2016-10-04: 10 mg via ORAL
  Filled 2016-10-04 (×2): qty 1

## 2016-10-04 MED ORDER — ONDANSETRON HCL 4 MG/2ML IJ SOLN: 4.0000 mg | Freq: Four times a day (QID) | INTRAMUSCULAR | Status: DC | PRN

## 2016-10-04 MED ORDER — ONDANSETRON HCL 4 MG PO TABS
4.0000 mg | ORAL_TABLET | Freq: Four times a day (QID) | ORAL | Status: DC | PRN
  Administered 2016-10-04: 4 mg via ORAL
  Filled 2016-10-04: qty 1

## 2016-10-04 MED ORDER — ACETAMINOPHEN 325 MG PO TABS: 650.0000 mg | ORAL_TABLET | Freq: Four times a day (QID) | ORAL | Status: DC | PRN

## 2016-10-04 MED ORDER — LORAZEPAM 2 MG/ML IJ SOLN: 2.0000 mg | Freq: Once | INTRAMUSCULAR | Status: DC | PRN

## 2016-10-04 MED ORDER — LEVETIRACETAM 500 MG PO TABS: 500.0000 mg | ORAL_TABLET | Freq: Two times a day (BID) | ORAL | 2 refills | Status: DC

## 2016-10-04 MED ORDER — SODIUM CHLORIDE 0.9 % IV BOLUS (SEPSIS)
500.0000 mL | Freq: Once | INTRAVENOUS | Status: AC
  Administered 2016-10-04: 500 mL via INTRAVENOUS

## 2016-10-04 MED ORDER — ENOXAPARIN SODIUM 40 MG/0.4ML ~~LOC~~ SOLN
40.0000 mg | SUBCUTANEOUS | Status: DC
  Administered 2016-10-04: 40 mg via SUBCUTANEOUS
  Filled 2016-10-04: qty 0.4

## 2016-10-04 MED ORDER — LEVETIRACETAM 500 MG PO TABS
500.0000 mg | ORAL_TABLET | Freq: Two times a day (BID) | ORAL | Status: DC
  Administered 2016-10-04 – 2016-10-05 (×2): 500 mg via ORAL
  Filled 2016-10-04 (×2): qty 1

## 2016-10-04 MED ORDER — LEVETIRACETAM 500 MG PO TABS: 500.0000 mg | ORAL_TABLET | Freq: Two times a day (BID) | ORAL | Status: DC

## 2016-10-04 MED ORDER — SODIUM CHLORIDE 0.9% FLUSH
3.0000 mL | Freq: Two times a day (BID) | INTRAVENOUS | Status: DC
  Administered 2016-10-04: 3 mL via INTRAVENOUS

## 2016-10-04 NOTE — Progress Notes (Signed)
*  PRELIMINARY RESULTS* Echocardiogram 2D Echocardiogram has been performed.  Margaret [name] 10/04/2016, 10:27 AM 

## 2016-10-04 NOTE — Progress Notes (Signed)
MD made aware of + orthostatic VS 

## 2016-10-04 NOTE — Progress Notes (Signed)
[name] NAME: Margaret [name]    MR#:  [id number]  DATE OF BIRTH:  1960-11-06  SUBJECTIVE:  CHIEF COMPLAINT:   Chief Complaint  Patient presents with  . Chest Pain   -under significant stress and also anxiety at work. Has been ha for almost a year now.    REVIEW OF SYSTEMS:  Review of Systems  Constitutional: Positive for malaise/fatigue. Negative for chills and fever.  HENT: Negative for congestion, ear discharge, hearing loss and nosebleeds.   Eyes: Negative for blurred vision and double vision.  Respiratory: Negative for cough, shortness of breath and wheezing.   Cardiovascular: Negative for chest pain, palpitations and leg swelling.  Gastrointestinal: Negative for abdominal pain, constipation, diarrhea, nausea and vomiting.  Genitourinary: Negative for dysuria.  Musculoskeletal: Negative for myalgias and neck pain.  Neurological: Positive for dizziness. Negative for seizures and headaches.       Staring spell  Psychiatric/Behavioral: The patient is nervous/anxious.     DRUG ALLERGIES:  No Known Allergies  VITALS:  Blood pressure 92/80, pulse 64, temperature 97.7 F (36.5 C), temperature source Oral, resp. rate 18, height 5\' 2"  (1.575 m), weight 54.9 kg (121 lb), SpO2 96 %.  PHYSICAL EXAMINATION:  Physical Exam  GENERAL:  56 y.o.-year-old patient lying in the bed with no acute distress.  EYES: Pupils equal, round, reactive to light and accommodation. No scleral icterus. Extraocular muscles intact.  HEENT: Head atraumatic, normocephalic. Oropharynx and nasopharynx clear.  NECK:  Supple, no jugular venous distention. No thyroid enlargement, no tenderness.  LUNGS: Normal breath sounds bilaterally, no wheezing, rales,rhonchi or crepitation. No use of accessory muscles of respiration.  CARDIOVASCULAR: S1, S2 normal. No murmurs, rubs, or gallops.  ABDOMEN: Soft, nontender, nondistended. Bowel sounds present. No  organomegaly or mass.  EXTREMITIES: No pedal edema, cyanosis, or clubbing.  NEUROLOGIC: Cranial nerves II through XII are intact. Muscle strength 5/5 in all extremities. Sensation intact. Gait not checked.  PSYCHIATRIC: The patient is alert and oriented x 3. Appears anxious SKIN: No obvious rash, lesion, or ulcer.    LABORATORY PANEL:   CBC  Recent Labs Lab 10/04/16 0414  WBC 12.0*  HGB 13.7  HCT 39.6  PLT 238   ------------------------------------------------------------------------------------------------------------------  Chemistries   Recent Labs Lab 10/03/16 2228 10/04/16 0414  NA 138 140  K 3.7 4.1  CL 102 105  CO2 27 29  GLUCOSE 139* 110*  BUN 16 15  CREATININE 0.99 0.83  CALCIUM 9.7 9.4  AST 49*  --   ALT 76*  --   ALKPHOS 419*  --   BILITOT 0.6  --    ------------------------------------------------------------------------------------------------------------------  Cardiac Enzymes  Recent Labs Lab 10/04/16 0414  TROPONINI <0.03   ------------------------------------------------------------------------------------------------------------------  RADIOLOGY:  Ct Head Wo Contrast  Result Date: 10/02/2016 CLINICAL DATA:  New onset seizure EXAM: CT HEAD WITHOUT CONTRAST TECHNIQUE: Contiguous axial images were obtained from the base of the skull through the vertex without intravenous contrast. COMPARISON:  None. FINDINGS: Brain: No evidence of acute infarction, hemorrhage, hydrocephalus, extra-axial collection or mass lesion/mass effect. Vascular: No hyperdense vessel or unexpected calcification. Skull: Negative Sinuses/Orbits: Negative Other: None IMPRESSION: Negative CT head Electronically Signed   By: Franchot Gallo M.D.   On: 10/02/2016 13:24   Dg Chest Portable 1 View  Result Date: 10/03/2016 CLINICAL DATA:  Chest pain.  Seen yesterday for the same thing. EXAM: PORTABLE CHEST 1 VIEW COMPARISON:  None. FINDINGS: Moderate hyperinflation. The lungs are  clear. The pulmonary vasculature is normal. Heart size is normal. Hilar and mediastinal contours are unremarkable. No pleural effusions. No pneumothorax. IMPRESSION: No active disease. Electronically Signed   By: Andreas Newport M.D.   On: 10/03/2016 23:02    EKG:   Orders placed or performed during the hospital encounter of 10/03/16  . ED EKG  . ED EKG    ASSESSMENT AND PLAN:   56 year old female with no significant past medical history presents to hospital after syncopal episode.  #1 syncope/staring spells-has been going on for almost a year now. -Denies any alcohol abuse. Drug screen is negative. -Just started on a Holter monitor as outpatient, normal on cardiac monitoring overnight. -Echo is done and is pending. CT of the head done in the ER was normal. -MRI of the brain and carotid Dopplers pending at this time. -EEG was done as outpatient yesterday did not show any acute findings. -Cardiology consult and neurology consult pending at this time. -Started on Lexapro as outpatient. -await neurology input to see if she was started on any medications for absent seizures -Check orthostatic blood pressure changes. -TSH is pending.  #2 anxiety-on Xanax as needed  #3 hypothyroidism continue Synthroid  Anticipate discharge if tests are negative  All the records are reviewed and case discussed with Care Management/Social Workerr. Management plans discussed with the patient, family and they are in agreement.  CODE STATUS: Full code  TOTAL TIME TAKING CARE OF THIS PATIENT: 38 minutes.   POSSIBLE D/C today, DEPENDING ON CLINICAL CONDITION.   Dequincy Born M.D on 10/04/2016 at 8:57 AM  Between 7am to 6pm - Pager - [phone]  After 6pm go to [email or web address] - password [email or web address] [facility]  Office  [phone]  CC: Primary care physician; Marden Noble, MD

## 2016-10-04 NOTE — Consult Note (Addendum)
Reason for Consult: black out spells  Referring Physician: Dr. Tressia Miners  CC: Black out spells   HPI: Margaret [name] is an 56 y.o. female who present with multiple pisodes lasting 10-15 second "blackouts". She states that she does not lose consciousness, and that she returns to full functionality immediately. As per pt this has been going on for years. She can have multiple a day or go few days without them.  Described as being in a daze. No LOC, shaking episodes, urinary incontinence.   Pt was found to have periods of bradycardia in the 40s. CTH and EEG no acute abnormalities    Past Medical History:  Diagnosis Date  . Hypothyroidism   . Ovarian cyst     Past Surgical History:  Procedure Laterality Date  . BREAST BIOPSY Left 2012   NEG  . BREAST CYST ASPIRATION Left 2011    Family History  Problem Relation Age of Onset  . Diabetes Mother   . CAD Father   . Breast cancer Neg Hx     Social History:  reports that she has quit smoking. She has never used smokeless tobacco. She reports that she drinks about 2.4 oz of alcohol per week . She reports that she does not use drugs.  No Known Allergies  Medications: I have reviewed the patient's current medications.  ROS: History obtained from the patient  General ROS: negative for - chills, fatigue, fever, night sweats, weight gain or weight loss Psychological ROS: anxiety Ophthalmic ROS: negative for - blurry vision, double vision, eye pain or loss of vision ENT ROS: negative for - epistaxis, nasal discharge, oral lesions, sore throat, tinnitus or vertigo Allergy and Immunology ROS: negative for - hives or itchy/watery eyes Hematological and Lymphatic ROS: negative for - bleeding problems, bruising or swollen lymph nodes Endocrine ROS: negative for - galactorrhea, hair pattern changes, polydipsia/polyuria or temperature intolerance Respiratory ROS: negative for - cough, hemoptysis, shortness of breath or  wheezing Cardiovascular ROS: negative for - chest pain, dyspnea on exertion, edema or irregular heartbeat Gastrointestinal ROS: negative for - abdominal pain, diarrhea, hematemesis, nausea/vomiting or stool incontinence Genito-Urinary ROS: negative for - dysuria, hematuria, incontinence or urinary frequency/urgency Musculoskeletal ROS: negative for - joint swelling or muscular weakness Neurological ROS: as noted in HPI Dermatological ROS: negative for rash and skin lesion changes  Physical Examination: Blood pressure 92/80, pulse 64, temperature 97.7 F (36.5 C), temperature source Oral, resp. rate 18, height 5\' 2"  (1.575 m), weight 54.9 kg (121 lb), SpO2 96 %.    Neurological Examination   Mental Status: Alert, oriented, thought content appropriate.  Speech fluent without evidence of aphasia.  Able to follow 3 step commands without difficulty. Cranial Nerves: II: Discs flat bilaterally; Visual fields grossly normal, pupils equal, round, reactive to light and accommodation III,IV, VI: ptosis not present, extra-ocular motions intact bilaterally V,VII: smile symmetric, facial light touch sensation normal bilaterally VIII: hearing normal bilaterally IX,X: gag reflex present XI: bilateral shoulder shrug XII: midline tongue extension Motor: Right : Upper extremity   5/5    Left:     Upper extremity   5/5  Lower extremity   5/5     Lower extremity   5/5 Tone and bulk:normal tone throughout; no atrophy noted Sensory: Pinprick and light touch intact throughout, bilaterally Deep Tendon Reflexes: 2+ and symmetric throughout Plantars: Right: downgoing   Left: downgoing Cerebellar: normal finger-to-nose, normal rapid alternating movements and normal heel-to-shin test Gait: not tested      Laboratory  Studies:   Basic Metabolic Panel:  Recent Labs Lab 10/02/16 1304 10/03/16 2228 10/04/16 0414  NA 139 138 140  K 3.5 3.7 4.1  CL 103 102 105  CO2 25 27 29   GLUCOSE 105* 139* 110*   BUN 11 16 15   CREATININE 0.84 0.99 0.83  CALCIUM 9.4 9.7 9.4    Liver Function Tests:  Recent Labs Lab 10/02/16 1304 10/03/16 2228  AST 53* 49*  ALT 91* 76*  ALKPHOS 417* 419*  BILITOT 0.8 0.6  PROT 7.9 8.2*  ALBUMIN 4.2 4.2    Recent Labs Lab 10/03/16 2228  LIPASE 40   No results for input(s): AMMONIA in the last 168 hours.  CBC:  Recent Labs Lab 10/02/16 1304 10/03/16 2228 10/04/16 0414  WBC 11.4* 13.8* 12.0*  NEUTROABS  --  7.8*  --   HGB 13.4 14.1 13.7  HCT 38.8 41.8 39.6  MCV 90.7 90.9 92.9  PLT 256 288 238    Cardiac Enzymes:  Recent Labs Lab 10/03/16 2228 10/04/16 0414  TROPONINI <0.03 <0.03    BNP: Invalid input(s): POCBNP  CBG: No results for input(s): GLUCAP in the last 168 hours.  Microbiology: No results found for this or any previous visit.  Coagulation Studies: No results for input(s): LABPROT, INR in the last 72 hours.  Urinalysis:  Recent Labs Lab 10/03/16 2330  COLORURINE YELLOW*  LABSPEC 1.017  PHURINE 6.0  GLUCOSEU NEGATIVE  HGBUR MODERATE*  BILIRUBINUR NEGATIVE  KETONESUR NEGATIVE  PROTEINUR NEGATIVE  NITRITE NEGATIVE  LEUKOCYTESUR NEGATIVE    Lipid Panel:  No results found for: CHOL, TRIG, HDL, CHOLHDL, VLDL, LDLCALC  HgbA1C: No results found for: HGBA1C  Urine Drug Screen:     Component Value Date/Time   LABOPIA NONE DETECTED 10/03/2016 2330   COCAINSCRNUR NONE DETECTED 10/03/2016 2330   LABBENZ NONE DETECTED 10/03/2016 2330   AMPHETMU NONE DETECTED 10/03/2016 2330   THCU NONE DETECTED 10/03/2016 2330   LABBARB NONE DETECTED 10/03/2016 2330    Alcohol Level:  Recent Labs Lab 10/02/16 1304  ETH <5    Imaging: Ct Head Wo Contrast  Result Date: 10/02/2016 CLINICAL DATA:  New onset seizure EXAM: CT HEAD WITHOUT CONTRAST TECHNIQUE: Contiguous axial images were obtained from the base of the skull through the vertex without intravenous contrast. COMPARISON:  None. FINDINGS: Brain: No evidence of  acute infarction, hemorrhage, hydrocephalus, extra-axial collection or mass lesion/mass effect. Vascular: No hyperdense vessel or unexpected calcification. Skull: Negative Sinuses/Orbits: Negative Other: None IMPRESSION: Negative CT head Electronically Signed   By: Franchot Gallo M.D.   On: 10/02/2016 13:24   Dg Chest Portable 1 View  Result Date: 10/03/2016 CLINICAL DATA:  Chest pain.  Seen yesterday for the same thing. EXAM: PORTABLE CHEST 1 VIEW COMPARISON:  None. FINDINGS: Moderate hyperinflation. The lungs are clear. The pulmonary vasculature is normal. Heart size is normal. Hilar and mediastinal contours are unremarkable. No pleural effusions. No pneumothorax. IMPRESSION: No active disease. Electronically Signed   By: Andreas Newport M.D.   On: 10/03/2016 23:02     Assessment/Plan:  56 y.o. female who present with multiple pisodes lasting 10-15 second "blackouts". She states that she does not lose consciousness, and that she returns to full functionality immediately. As per pt this has been going on for years. She can have multiple a day or go few days without them.  Described as being in a daze. No LOC, shaking episodes, urinary incontinence.   Pt was found to have periods of bradycardia in  the 40s. CTH and EEG no acute abnormalities   I am not convinced this is seizure related or partial seizures.  Pt was seen by cardiology and has a holter monitor.  Pt had periods of bradycardia and this could be pre syncope related or in combination with stress/anxiety I don't want to start any anti epileptic medications.  Follow up with Neurology as out patient with Dr. 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  Started Keppra 500 BID D/c planning Follows up with Dr. Melrose Nakayama Aug 6th No driving until then MRI repeat with and w/out contrast in about 6 weeks.  Leotis Pain

## 2016-10-04 NOTE — Consult Note (Signed)
Margaret [name] is a 56 y.o. female  [id number]  Primary Cardiologist: Neoma Laming Reason for Consultation: Presyncope  HPI: This is a 56 year old white female with a past medical history of hypothyroidism and anxiety disorder presented to the hospital with having 10-15 seconds episodes of blackout and chest pain. She was found to have sinus bradycardia in the 40s and was relatively hypotensive when she came in was admitted to the hospital for rule out MI. Currently she denies any chest pain.   Review of Systems: No frank syncope or chest pain at this time no orthopnea PND or leg swelling   Past Medical History:  Diagnosis Date  . Hypothyroidism   . Ovarian cyst     Medications Prior to Admission  Medication Sig Dispense Refill  . ALPRAZolam (XANAX) 0.25 MG tablet Take 0.25 mg by mouth daily as needed for anxiety.    Marland Kitchen escitalopram (LEXAPRO) 10 MG tablet Take 10 mg by mouth daily.    Marland Kitchen SYNTHROID 88 MCG tablet Take 88 mcg by mouth daily.  0     . enoxaparin (LOVENOX) injection  40 mg Subcutaneous Q24H  . escitalopram  10 mg Oral Daily  . levothyroxine  88 mcg Oral QAC breakfast    Infusions: . sodium chloride      No Known Allergies  Social History   Social History  . Marital status: Married    Spouse name: N/A  . Number of children: N/A  . Years of education: N/A   Occupational History  . Not on file.   Social History Main Topics  . Smoking status: Former Research scientist (life sciences)  . Smokeless tobacco: Never Used  . Alcohol use 2.4 oz/week    4 Glasses of wine per week  . Drug use: No  . Sexual activity: Not on file   Other Topics Concern  . Not on file   Social History Narrative  . No narrative on file    Family History  Problem Relation Age of Onset  . Diabetes Mother   . CAD Father   . Breast cancer Neg Hx     PHYSICAL EXAM: Vitals:   10/04/16 0221 10/04/16 0725  BP: 92/80 106/66  Pulse: 64 70  Resp: 18 18  Temp: 97.7 F (36.5 C)       Intake/Output Summary (Last 24 hours) at 10/04/16 1122 Last data filed at 10/04/16 0900  Gross per 24 hour  Intake              240 ml  Output                0 ml  Net              240 ml    General:  Well appearing. No respiratory difficulty HEENT: normal Neck: supple. no JVD. Carotids 2+ bilat; no bruits. No lymphadenopathy or thryomegaly appreciated. Cor: PMI nondisplaced. Regular rate & rhythm. No rubs, gallops or murmurs. Lungs: clear Abdomen: soft, nontender, nondistended. No hepatosplenomegaly. No bruits or masses. Good bowel sounds. Extremities: no cyanosis, clubbing, rash, edema Neuro: alert & oriented x 3, cranial nerves grossly intact. moves all 4 extremities w/o difficulty. Affect pleasant.  OIZ:TIWPY rhythm with no acute changes  Results for orders placed or performed during the hospital encounter of 10/03/16 (from the past 24 hour(s))  Comprehensive metabolic panel     Status: Abnormal   Collection Time: 10/03/16 10:28 PM  Result Value Ref Range   Sodium 138 135 - 145  mmol/L   Potassium 3.7 3.5 - 5.1 mmol/L   Chloride 102 101 - 111 mmol/L   CO2 27 22 - 32 mmol/L   Glucose, Bld 139 (H) 65 - 99 mg/dL   BUN 16 6 - 20 mg/dL   Creatinine, Ser 0.99 0.44 - 1.00 mg/dL   Calcium 9.7 8.9 - 10.3 mg/dL   Total Protein 8.2 (H) 6.5 - 8.1 g/dL   Albumin 4.2 3.5 - 5.0 g/dL   AST 49 (H) 15 - 41 U/L   ALT 76 (H) 14 - 54 U/L   Alkaline Phosphatase 419 (H) 38 - 126 U/L   Total Bilirubin 0.6 0.3 - 1.2 mg/dL   GFR calc non Af Amer >60 >60 mL/min   GFR calc Af Amer >60 >60 mL/min   Anion gap 9 5 - 15  Lipase, blood     Status: None   Collection Time: 10/03/16 10:28 PM  Result Value Ref Range   Lipase 40 11 - 51 U/L  Brain natriuretic peptide     Status: None   Collection Time: 10/03/16 10:28 PM  Result Value Ref Range   B Natriuretic Peptide 14.0 0.0 - 100.0 pg/mL  Troponin I     Status: None   Collection Time: 10/03/16 10:28 PM  Result Value Ref Range   Troponin I  <0.03 <0.03 ng/mL  CBC with Differential     Status: Abnormal   Collection Time: 10/03/16 10:28 PM  Result Value Ref Range   WBC 13.8 (H) 3.6 - 11.0 K/uL   RBC 4.60 3.80 - 5.20 MIL/uL   Hemoglobin 14.1 12.0 - 16.0 g/dL   HCT 41.8 35.0 - 47.0 %   MCV 90.9 80.0 - 100.0 fL   MCH 30.7 26.0 - 34.0 pg   MCHC 33.8 32.0 - 36.0 g/dL   RDW 13.7 11.5 - 14.5 %   Platelets 288 150 - 440 K/uL   Neutrophils Relative % 57 %   Neutro Abs 7.8 (H) 1.4 - 6.5 K/uL   Lymphocytes Relative 30 %   Lymphs Abs 4.2 (H) 1.0 - 3.6 K/uL   Monocytes Relative 8 %   Monocytes Absolute 1.1 (H) 0.2 - 0.9 K/uL   Eosinophils Relative 3 %   Eosinophils Absolute 0.5 0 - 0.7 K/uL   Basophils Relative 2 %   Basophils Absolute 0.2 (H) 0 - 0.1 K/uL  Lactic acid, plasma     Status: None   Collection Time: 10/03/16 10:29 PM  Result Value Ref Range   Lactic Acid, Venous 1.3 0.5 - 1.9 mmol/L  Urinalysis, Complete w Microscopic     Status: Abnormal   Collection Time: 10/03/16 11:30 PM  Result Value Ref Range   Color, Urine YELLOW (A) YELLOW   APPearance CLEAR (A) CLEAR   Specific Gravity, Urine 1.017 1.005 - 1.030   pH 6.0 5.0 - 8.0   Glucose, UA NEGATIVE NEGATIVE mg/dL   Hgb urine dipstick MODERATE (A) NEGATIVE   Bilirubin Urine NEGATIVE NEGATIVE   Ketones, ur NEGATIVE NEGATIVE mg/dL   Protein, ur NEGATIVE NEGATIVE mg/dL   Nitrite NEGATIVE NEGATIVE   Leukocytes, UA NEGATIVE NEGATIVE   RBC / HPF 0-5 0 - 5 RBC/hpf   WBC, UA 0-5 0 - 5 WBC/hpf   Bacteria, UA NONE SEEN NONE SEEN   Squamous Epithelial / LPF 0-5 (A) NONE SEEN   Mucous PRESENT   Urine Drug Screen, Qualitative     Status: None   Collection Time: 10/03/16 11:30 PM  Result Value  Ref Range   Tricyclic, Ur Screen NONE DETECTED NONE DETECTED   Amphetamines, Ur Screen NONE DETECTED NONE DETECTED   MDMA (Ecstasy)Ur Screen NONE DETECTED NONE DETECTED   Cocaine Metabolite,Ur [place] NONE DETECTED NONE DETECTED   Opiate, Ur Screen NONE DETECTED NONE DETECTED    Phencyclidine (PCP) Ur S NONE DETECTED NONE DETECTED   Cannabinoid 50 Ng, Ur [place] NONE DETECTED NONE DETECTED   Barbiturates, Ur Screen NONE DETECTED NONE DETECTED   Benzodiazepine, Ur Scrn NONE DETECTED NONE DETECTED   Methadone Scn, Ur NONE DETECTED NONE DETECTED  Pregnancy, urine     Status: None   Collection Time: 10/03/16 11:30 PM  Result Value Ref Range   Preg Test, Ur NEGATIVE NEGATIVE  Basic metabolic panel     Status: Abnormal   Collection Time: 10/04/16  4:14 AM  Result Value Ref Range   Sodium 140 135 - 145 mmol/L   Potassium 4.1 3.5 - 5.1 mmol/L   Chloride 105 101 - 111 mmol/L   CO2 29 22 - 32 mmol/L   Glucose, Bld 110 (H) 65 - 99 mg/dL   BUN 15 6 - 20 mg/dL   Creatinine, Ser 0.83 0.44 - 1.00 mg/dL   Calcium 9.4 8.9 - 10.3 mg/dL   GFR calc non Af Amer >60 >60 mL/min   GFR calc Af Amer >60 >60 mL/min   Anion gap 6 5 - 15  CBC     Status: Abnormal   Collection Time: 10/04/16  4:14 AM  Result Value Ref Range   WBC 12.0 (H) 3.6 - 11.0 K/uL   RBC 4.27 3.80 - 5.20 MIL/uL   Hemoglobin 13.7 12.0 - 16.0 g/dL   HCT 39.6 35.0 - 47.0 %   MCV 92.9 80.0 - 100.0 fL   MCH 32.1 26.0 - 34.0 pg   MCHC 34.6 32.0 - 36.0 g/dL   RDW 13.6 11.5 - 14.5 %   Platelets 238 150 - 440 K/uL  Troponin I     Status: None   Collection Time: 10/04/16  4:14 AM  Result Value Ref Range   Troponin I <0.03 <0.03 ng/mL   Ct Head Wo Contrast  Result Date: 10/02/2016 CLINICAL DATA:  New onset seizure EXAM: CT HEAD WITHOUT CONTRAST TECHNIQUE: Contiguous axial images were obtained from the base of the skull through the vertex without intravenous contrast. COMPARISON:  None. FINDINGS: Brain: No evidence of acute infarction, hemorrhage, hydrocephalus, extra-axial collection or mass lesion/mass effect. Vascular: No hyperdense vessel or unexpected calcification. Skull: Negative Sinuses/Orbits: Negative Other: None IMPRESSION: Negative CT head Electronically Signed   By: Franchot Gallo M.D.   On: 10/02/2016 13:24    Dg Chest Portable 1 View  Result Date: 10/03/2016 CLINICAL DATA:  Chest pain.  Seen yesterday for the same thing. EXAM: PORTABLE CHEST 1 VIEW COMPARISON:  None. FINDINGS: Moderate hyperinflation. The lungs are clear. The pulmonary vasculature is normal. Heart size is normal. Hilar and mediastinal contours are unremarkable. No pleural effusions. No pneumothorax. IMPRESSION: No active disease. Electronically Signed   By: Andreas Newport M.D.   On: 10/03/2016 23:02     ASSESSMENT AND PLAN: Atypical chest pain with questionable presyncope. EKG is unremarkable and patient has ruled out for myocardial infarction and I don't see any where where patient had heart rate in the 40s. Currently he is in sinus rhythm and a reasonable heart rate. Patient can be discharged with follow-up in the office since the neurology says is not seizure disorder. We will do event monitor  in the office to further evaluate the patient to see if patient needs pacemaker. Patient can be discharged with follow-up Tuesday at 11:00 in my office.  Frederick Klinger A

## 2016-10-04 NOTE — H&P (Signed)
[facility] [place] NAME: Margaret [name]    MR#:  [id number]  DATE OF BIRTH:  20-Oct-1960  DATE OF ADMISSION:  10/03/2016  PRIMARY CARE PHYSICIAN: Marden Noble, MD   REQUESTING/REFERRING PHYSICIAN: Cinda Quest, MD  CHIEF COMPLAINT:   Chief Complaint  Patient presents with  . Chest Pain    HISTORY OF PRESENT ILLNESS:  Margaret [name]  is a 56 y.o. female who presents with Persistent episodes of what sound like potentially absence seizures. She describes these episodes as 10-15 second "blackouts". She states that she does not lose consciousness, and that she returns to full functionality immediately. Family member in the room with her husband witnesses episode, and states that she is somewhat confused during the brief period of time, but then agrees that she regains full functionality immediately after. Patient also describes what she calls a "knot" in her chest. She saw a neurologist today, who performed an EEG CT scan, and now recommended Holter monitoring. However, with this discomfort in her chest she came to the ED for evaluation. Initial workup within normal limits, hospitalist called for admission  PAST MEDICAL HISTORY:   Past Medical History:  Diagnosis Date  . Hypothyroidism   . Ovarian cyst     PAST SURGICAL HISTORY:   Past Surgical History:  Procedure Laterality Date  . BREAST BIOPSY Left 2012   NEG  . BREAST CYST ASPIRATION Left 2011    SOCIAL HISTORY:   Social History  Substance Use Topics  . Smoking status: Former Research scientist (life sciences)  . Smokeless tobacco: Never Used  . Alcohol use 2.4 oz/week    4 Glasses of wine per week    FAMILY HISTORY:   Family History  Problem Relation Age of Onset  . Diabetes Mother   . CAD Father   . Breast cancer Neg Hx     DRUG ALLERGIES:  No Known Allergies  MEDICATIONS AT HOME:   Prior to Admission medications   Medication Sig Start Date End Date Taking? Authorizing Provider   ALPRAZolam Duanne Moron) 0.25 MG tablet Take 0.25 mg by mouth daily as needed for anxiety.   Yes [provider]  escitalopram (LEXAPRO) 10 MG tablet Take 10 mg by mouth daily. 10/03/16 01/01/17 Yes [provider]  SYNTHROID 88 MCG tablet Take 88 mcg by mouth daily. 06/30/16  Yes [provider]    REVIEW OF SYSTEMS:  Review of Systems  Constitutional: Negative for chills, fever, malaise/fatigue and weight loss.  HENT: Negative for ear pain, hearing loss and tinnitus.   Eyes: Negative for blurred vision, double vision, pain and redness.  Respiratory: Negative for cough, hemoptysis and shortness of breath.   Cardiovascular: Positive for chest pain. Negative for palpitations, orthopnea and leg swelling.  Gastrointestinal: Negative for abdominal pain, constipation, diarrhea, nausea and vomiting.  Genitourinary: Negative for dysuria, frequency and hematuria.  Musculoskeletal: Negative for back pain, joint pain and neck pain.  Skin:       No acne, rash, or lesions  Neurological: Negative for dizziness, tremors, focal weakness and weakness.       "Blackout" episodes, see history of present illness  Endo/Heme/Allergies: Negative for polydipsia. Does not bruise/bleed easily.  Psychiatric/Behavioral: Negative for depression. The patient is not nervous/anxious and does not have insomnia.      VITAL SIGNS:   Vitals:   10/03/16 2227 10/03/16 2231 10/03/16 2236  BP:   (!) 147/92  Pulse:  90 81  Resp:  20 19  Temp:   98.1 F (36.7 C)  TempSrc:   Oral  SpO2:   96%  Weight: 54.9 kg (121 lb)    Height: 5\' 2"  (1.575 m)     Wt Readings from Last 3 Encounters:  10/03/16 54.9 kg (121 lb)  10/02/16 54.9 kg (121 lb)    PHYSICAL EXAMINATION:  Physical Exam  Vitals reviewed. Constitutional: She is oriented to person, place, and time. She appears well-developed and well-nourished. No distress.  HENT:  Head: Normocephalic and atraumatic.  Mouth/Throat: Oropharynx is clear  and moist.  Eyes: Pupils are equal, round, and reactive to light. Conjunctivae and EOM are normal. No scleral icterus.  Neck: Normal range of motion. Neck supple. No JVD present. No thyromegaly present.  Cardiovascular: Normal rate, regular rhythm and intact distal pulses.  Exam reveals no gallop and no friction rub.   No murmur heard. Respiratory: Effort normal and breath sounds normal. No respiratory distress. She has no wheezes. She has no rales.  GI: Soft. Bowel sounds are normal. She exhibits no distension. There is no tenderness.  Musculoskeletal: Normal range of motion. She exhibits no edema.  No arthritis, no gout  Lymphadenopathy:    She has no cervical adenopathy.  Neurological: She is alert and oriented to person, place, and time. No cranial nerve deficit.  No dysarthria, no aphasia  Skin: Skin is warm and dry. No rash noted. No erythema.  Psychiatric: She has a normal mood and affect. Her behavior is normal. Judgment and thought content normal.    LABORATORY PANEL:   CBC  Recent Labs Lab 10/03/16 2228  WBC 13.8*  HGB 14.1  HCT 41.8  PLT 288   ------------------------------------------------------------------------------------------------------------------  Chemistries   Recent Labs Lab 10/03/16 2228  NA 138  K 3.7  CL 102  CO2 27  GLUCOSE 139*  BUN 16  CREATININE 0.99  CALCIUM 9.7  AST 49*  ALT 76*  ALKPHOS 419*  BILITOT 0.6   ------------------------------------------------------------------------------------------------------------------  Cardiac Enzymes  Recent Labs Lab 10/03/16 2228  TROPONINI <0.03   ------------------------------------------------------------------------------------------------------------------  RADIOLOGY:  Ct Head Wo Contrast  Result Date: 10/02/2016 CLINICAL DATA:  New onset seizure EXAM: CT HEAD WITHOUT CONTRAST TECHNIQUE: Contiguous axial images were obtained from the base of the skull through the vertex without  intravenous contrast. COMPARISON:  None. FINDINGS: Brain: No evidence of acute infarction, hemorrhage, hydrocephalus, extra-axial collection or mass lesion/mass effect. Vascular: No hyperdense vessel or unexpected calcification. Skull: Negative Sinuses/Orbits: Negative Other: None IMPRESSION: Negative CT head Electronically Signed   By: Franchot Gallo M.D.   On: 10/02/2016 13:24   Dg Chest Portable 1 View  Result Date: 10/03/2016 CLINICAL DATA:  Chest pain.  Seen yesterday for the same thing. EXAM: PORTABLE CHEST 1 VIEW COMPARISON:  None. FINDINGS: Moderate hyperinflation. The lungs are clear. The pulmonary vasculature is normal. Heart size is normal. Hilar and mediastinal contours are unremarkable. No pleural effusions. No pneumothorax. IMPRESSION: No active disease. Electronically Signed   By: Andreas Newport M.D.   On: 10/03/2016 23:02    EKG:   Orders placed or performed during the hospital encounter of 10/03/16  . ED EKG  . 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 Management plans discussed with the patient and/or family.  DVT PROPHYLAXIS: SubQ lovenox  GI PROPHYLAXIS: None  ADMISSION STATUS: Observation  CODE STATUS: Full Code Status History    This patient does not have a recorded code status. Please follow your organizational policy for patients in this situation.      TOTAL TIME TAKING CARE OF THIS PATIENT: 40 minutes.   Manson [place] 10/04/2016, 12:09 AM  [facility]  Office  [phone]  CC: Primary care physician; Marden Noble, MD  Note:  This document was prepared using Dragon voice recognition  software and may include unintentional dictation errors.

## 2016-10-04 NOTE — Progress Notes (Signed)
Pt and pts husband stated to RN that they do not feel comfortable being discharged from hospital with pt starting new medication for seizures/ requesting to stay and be monitored over night/ MD aware/ ok to stay over night

## 2016-10-05 ENCOUNTER — Emergency Department
Admission: EM | Admit: 2016-10-05 | Discharge: 2016-10-05 | Disposition: A | Discharge status: Home / Self Care | Payer: Commercial Managed Care - PPO | Attending: Emergency Medicine | Admitting: Emergency Medicine

## 2016-10-05 ENCOUNTER — Encounter: Payer: Self-pay | Admitting: Emergency Medicine

## 2016-10-05 ENCOUNTER — Emergency Department: Payer: Commercial Managed Care - PPO

## 2016-10-05 DIAGNOSIS — E039 Hypothyroidism, unspecified: Secondary | ICD-10-CM | POA: Diagnosis not present

## 2016-10-05 DIAGNOSIS — R930 Abnormal findings on diagnostic imaging of skull and head, not elsewhere classified: Secondary | ICD-10-CM | POA: Insufficient documentation

## 2016-10-05 DIAGNOSIS — R9389 Abnormal findings on diagnostic imaging of other specified body structures: Secondary | ICD-10-CM

## 2016-10-05 DIAGNOSIS — R569 Unspecified convulsions: Secondary | ICD-10-CM

## 2016-10-05 DIAGNOSIS — Z79899 Other long term (current) drug therapy: Secondary | ICD-10-CM | POA: Insufficient documentation

## 2016-10-05 DIAGNOSIS — Z0000 Encounter for general adult medical examination without abnormal findings: Secondary | ICD-10-CM | POA: Insufficient documentation

## 2016-10-05 DIAGNOSIS — F419 Anxiety disorder, unspecified: Secondary | ICD-10-CM | POA: Diagnosis not present

## 2016-10-05 DIAGNOSIS — Z87891 Personal history of nicotine dependence: Secondary | ICD-10-CM | POA: Diagnosis not present

## 2016-10-05 DIAGNOSIS — D496 Neoplasm of unspecified behavior of brain: Secondary | ICD-10-CM

## 2016-10-05 LAB — COMPREHENSIVE METABOLIC PANEL
ALK PHOS: 574 U/L — AB (ref 38–126)
ALT: 104 U/L — AB (ref 14–54)
ANION GAP: 11 (ref 5–15)
AST: 94 U/L — ABNORMAL HIGH (ref 15–41)
Albumin: 4.5 g/dL (ref 3.5–5.0)
BUN: 12 mg/dL (ref 6–20)
CO2: 28 mmol/L (ref 22–32)
Calcium: 9.6 mg/dL (ref 8.9–10.3)
Chloride: 100 mmol/L — ABNORMAL LOW (ref 101–111)
Creatinine, Ser: 0.82 mg/dL (ref 0.44–1.00)
Glucose, Bld: 102 mg/dL — ABNORMAL HIGH (ref 65–99)
Potassium: 3.9 mmol/L (ref 3.5–5.1)
SODIUM: 139 mmol/L (ref 135–145)
Total Bilirubin: 0.6 mg/dL (ref 0.3–1.2)
Total Protein: 8.8 g/dL — ABNORMAL HIGH (ref 6.5–8.1)

## 2016-10-05 LAB — CBC
HCT: 42.2 % (ref 35.0–47.0)
HEMOGLOBIN: 14.8 g/dL (ref 12.0–16.0)
MCH: 32 pg (ref 26.0–34.0)
MCHC: 35 g/dL (ref 32.0–36.0)
MCV: 91.3 fL (ref 80.0–100.0)
Platelets: 283 10*3/uL (ref 150–440)
RBC: 4.63 MIL/uL (ref 3.80–5.20)
RDW: 13.7 % (ref 11.5–14.5)
WBC: 12 10*3/uL — ABNORMAL HIGH (ref 3.6–11.0)

## 2016-10-05 LAB — HIV ANTIBODY (ROUTINE TESTING W REFLEX): HIV SCREEN 4TH GENERATION: NONREACTIVE

## 2016-10-05 MED ORDER — GADOBENATE DIMEGLUMINE 529 MG/ML IV SOLN
10.0000 mL | Freq: Once | INTRAVENOUS | Status: AC | PRN
  Administered 2016-10-05: 10 mL via INTRAVENOUS

## 2016-10-05 MED ORDER — LEVETIRACETAM 500 MG PO TABS: 500.0000 mg | ORAL_TABLET | Freq: Two times a day (BID) | ORAL | 2 refills | Status: DC

## 2016-10-05 MED ORDER — ALPRAZOLAM 0.5 MG PO TABS
1.0000 mg | ORAL_TABLET | Freq: Once | ORAL | Status: AC
  Administered 2016-10-05: 1 mg via ORAL

## 2016-10-05 MED ORDER — ALPRAZOLAM 0.5 MG PO TABS
ORAL_TABLET | ORAL | Status: AC
  Filled 2016-10-05: qty 2

## 2016-10-05 NOTE — Progress Notes (Signed)
Was called earlier today stating that patient was upset regarding her MRI results. On MRI pt was found to have T2 hyperintensity in the R mesial temporal lobe. The read was reported as possible tumor. I personally believe this is likely related to potential of partial seizures as temporal lobe is the most common place of partial seizures.    There is no clear edema on it.  Even if it is primary tumor this can be followed up out pt as this is not emergent.  Called patient today at [phone].  She was very upset stating I didn't tell her it was tumor.  Explained same statement as above and that is in my prior note stating likely related to partial seizures.   Recommended MRI brain w/ and w/out to look for enhancement in 6 weeks as to see if there is improvement/resoution after starting Keppra 500 BID Pt was addiment that she needed to know answer today and wanted MRI to be read today.  I asked her not to drive as possibly new diagnosis of seizures but she stated that she is so anxious and is going to drive to hospital.   Leotis Pain

## 2016-10-05 NOTE — Progress Notes (Signed)
Discharge instructions explained to pt and pts husband/ verbalized an understanding/ iv and tele removed/ RX given to pt/ informed pt not to drive until seen by Dr. Melrose Nakayama as ordered/ transported off unit via wheelchair.

## 2016-10-05 NOTE — ED Triage Notes (Signed)
Pt here for MRI brain with and without.  Pt was admitted for seizures and went home and saw on Mychart had brain tumor according to MRI but reports MD told her it was possible ear infection.  Pt anxious at this time.  Pt called doctors once discharged and saw that this morning.  Doctor sent pt to ED for contrast MRI.  Attempted to have pt readmitted which is why pt was not triaged initially but per housing sup MD said no reason for this.  Pt will go through ED.NAD. No seizure since leaving. Ambulatory.  VSS

## 2016-10-05 NOTE — Discharge Summary (Signed)
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 Hypothyroidism   . Ovarian cyst     HOSPITAL COURSE:   56 year old female with no significant past medical history presents to hospital after syncopal episode.  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 -Denies any alcohol abuse. Drug screen is negative. Status post Holter monitor which was normal -Echo is normal. CT of the head done in the ER was normal. 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 -carotid Dopplers are negative.. -EEG was done as outpatient did not show any acute findings. -Cardiology consult and neurology consults appreciated. -started on keppra and driving restrictions explained. Follow up with neurology as outpatient  #2 anxiety-on Xanax as needed Does not want to continue her lexapro due to side effects- since it was just started prior to discharge and now since her symptoms are not secondary to stress/anxiety- can be discontinued  #3 hypothyroidism continue Synthroid  Discharge today   DISCHARGE CONDITIONS:   guarded CONSULTS OBTAINED:   Treatment Team:  Dionisio David, MD Leotis Pain, MD  DRUG ALLERGIES:   No Known Allergies DISCHARGE MEDICATIONS:   Allergies as of 10/05/2016   No Known Allergies     Medication List    STOP taking these medications   escitalopram 10 MG tablet Commonly known as:  LEXAPRO     TAKE these medications   ALPRAZolam 0.25 MG tablet Commonly known as:  XANAX Take 0.25 mg by mouth daily as needed for anxiety.   levETIRAcetam 500 MG tablet Commonly known as:  KEPPRA Take 1 tablet (500 mg total) by mouth 2 (two) times daily.   SYNTHROID 88 MCG tablet Generic drug:  levothyroxine Take 88 mcg by mouth daily.        DISCHARGE INSTRUCTIONS:   1. PCP f/u in 2 weeks 2. Neurology f/u in 1 week  DIET:   Regular diet  ACTIVITY:   Activity as tolerated  OXYGEN:   Home Oxygen: No.  Oxygen Delivery: room air  DISCHARGE LOCATION:   home   If you experience worsening of your admission symptoms, develop shortness of breath, life threatening emergency, suicidal or homicidal thoughts you must seek medical attention immediately by calling 911 or calling your MD immediately  if symptoms less severe.  You Must read complete instructions/literature along with all the possible adverse reactions/side effects for all the Medicines you take and that have been prescribed to you. Take any new Medicines after you have completely understood and accpet all the possible adverse reactions/side effects.   Please note  You were cared for by a hospitalist during your hospital stay. If you have any questions about your discharge medications or the care you received while you were in the hospital after you are discharged, you can call the unit and asked to speak with the hospitalist on call  if the hospitalist that took care of you is not available. Once you are discharged, your primary care physician will handle any further medical issues. Please note that NO REFILLS for any discharge medications will be authorized once you are discharged, as it is imperative that you return to your primary care physician (or establish a relationship with a primary care physician if you do not have one) for  your aftercare needs so that they can reassess your need for medications and monitor your lab values.    On the day of Discharge:  VITAL SIGNS:   Blood pressure (!) 95/56, pulse (!) 57, temperature 98.3 F (36.8 C), temperature source Oral, resp. rate 18, height 5\' 2"  (1.575 m), weight 54.9 kg (121 lb), SpO2 94 %.  PHYSICAL EXAMINATION:    GENERAL:  56 y.o.-year-old patient lying in the bed with no acute distress.  EYES: Pupils equal, round, reactive to light and accommodation. No scleral icterus. Extraocular muscles intact.  HEENT: Head atraumatic, normocephalic. Oropharynx and nasopharynx clear.  NECK:  Supple, no jugular venous distention. No thyroid enlargement, no tenderness.  LUNGS: Normal breath sounds bilaterally, no wheezing, rales,rhonchi or crepitation. No use of accessory muscles of respiration.  CARDIOVASCULAR: S1, S2 normal. No murmurs, rubs, or gallops.  ABDOMEN: Soft, non-tender, non-distended. Bowel sounds present. No organomegaly or mass.  EXTREMITIES: No pedal edema, cyanosis, or clubbing.  NEUROLOGIC: Cranial nerves II through XII are intact. Muscle strength 5/5 in all extremities. Sensation intact. Gait not checked.  PSYCHIATRIC: The patient is alert and oriented x 3.  SKIN: No obvious rash, lesion, or ulcer.   DATA REVIEW:   CBC  Recent Labs Lab 10/04/16 0414  WBC 12.0*  HGB 13.7  HCT 39.6  PLT 238    Chemistries   Recent Labs Lab 10/03/16 2228 10/04/16 0414  NA 138 140  K 3.7 4.1  CL 102 105  CO2 27 29  GLUCOSE 139* 110*  BUN 16 15  CREATININE 0.99 0.83  CALCIUM 9.7 9.4  AST 49*  --   ALT 76*  --   ALKPHOS 419*  --   BILITOT 0.6  --      Microbiology Results  No results found for this or any previous visit.  RADIOLOGY:  Mr Brain Wo Contrast  Result Date: 10/04/2016 CLINICAL DATA:  Possible seizures or syncope/presyncope. Multiple brief episodes of blackouts lasting 10-15 seconds. Found have periods of bradycardia in the emergency  department. EXAM: MRI HEAD WITHOUT CONTRAST TECHNIQUE: Multiplanar, multiecho pulse sequences of the brain and surrounding structures were obtained without intravenous contrast. COMPARISON:  10/02/2016 head CT FINDINGS: Brain: There is no evidence of acute infarct, intracranial hemorrhage, mass, midline shift, or extra-axial fluid collection. The ventricles and sulci are within normal limits for age. Dedicated temporal lobe imaging demonstrates asymmetry of the hippocampi, with the right being larger than the left and demonstrating mild T2 hyperintensity. The right amygdala also appears enlarged and T2 hyperintense compared to the left. Otherwise, the brain is normal in signal. Vascular: Major intracranial vascular flow voids are preserved. Skull and upper cervical spine: No suspicious marrow lesion. Sinuses/Orbits: Unremarkable orbits. Small volume left maxillary sinus fluid. Clear mastoid air cells. Other: None. IMPRESSION: 1. Asymmetric enlargement and T2 signal in the mesial right temporal lobe. 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 Recommend short-term follow-up brain MRI with contrast. 2. Otherwise unremarkable brain MRI. Electronically Signed   By: Logan Bores M.D.   On: 10/04/2016  13:45   US Carotid Bilateral  Result Date: 10/04/2016 CLINICAL DATA:  Syncope. EXAM: BILATERAL CAROTID DUPLEX ULTRASOUND TECHNIQUE: Pearline Cables scale imaging, color Doppler and duplex ultrasound was performed of bilateral carotid and vertebral arteries in the neck. COMPARISON:  None. TECHNIQUE: Quantification of carotid stenosis is based on velocity parameters that correlate the residual internal carotid diameter with NASCET-based stenosis levels, using the diameter of the distal internal carotid lumen as the denominator for stenosis measurement. The following velocity measurements were obtained: PEAK SYSTOLIC/END DIASTOLIC RIGHT ICA:                     75/31cm/sec  CCA:                     03/15XY/VOP SYSTOLIC ICA/CCA RATIO:  1.1 DIASTOLIC ICA/CCA RATIO: 1.5 ECA:                     71cm/sec LEFT ICA:                     74/28cm/sec CCA:                     92/92KM/QKM SYSTOLIC ICA/CCA RATIO:  0.8 DIASTOLIC ICA/CCA RATIO: 1.2 ECA:                     95cm/sec FINDINGS: RIGHT CAROTID ARTERY: No significant plaque accumulation or stenosis. Normal waveforms and color Doppler signal. RIGHT VERTEBRAL ARTERY:  Normal flow direction and waveform. LEFT CAROTID ARTERY: No significant plaque accumulation or stenosis. Normal waveforms and color Doppler signal. LEFT VERTEBRAL ARTERY: Normal flow direction and waveform. IMPRESSION: 1. No significant carotid plaque or stenosis. 2.  Antegrade bilateral vertebral arterial flow. Electronically Signed   By: Lucrezia Europe M.D.   On: 10/04/2016 11:42     Management plans discussed with the patient, family and they are in agreement.  CODE STATUS:     Code Status Orders        Start     Ordered   10/04/16 0225  Full code  Continuous     10/04/16 0224    Code Status History    Date Active Date Inactive Code Status Order ID Comments User Context   This patient has a current code status but no historical code status.      TOTAL TIME TAKING CARE OF THIS PATIENT: 37 minutes.    Jasen Hartstein M.D on 10/05/2016 at 7:41 AM  Between 7am to 6pm - Pager - [phone]  After 6pm go to [email or web address] - Proofreader  [facility]  Office  [phone]  CC: Primary care physician; Marden Noble, MD   Note: This dictation was prepared with Dragon dictation along with smaller phrase technology. Any transcriptional errors that result from this process are unintentional.

## 2016-10-05 NOTE — ED Triage Notes (Signed)
Nurse supervisor checking to get pt admitted directly

## 2016-10-05 NOTE — ED Provider Notes (Signed)
[facility] Emergency Department Provider Note  Time seen: 5:50 PM  I have reviewed the triage vital signs and the nursing notes.   HISTORY  Chief Complaint MRI    HPI Margaret [name] is a 56 y.o. female for the past medical history of hypothyroidism, anxiety, presents to the emergency department for evaluation of possible brain tumor. According to the patient she was recently admitted to the hospital for seizures she has been experiencing. Patient was discharged earlier today. During her admission she had an MRI performed of the brain and was told that there is a possibility of scar tissue on the MRI recommended a repeat MRI with contrast as an outpatient. Patient states she went home and began reviewing her records on my chart, and saw the MRI read which also mentioned the possibility of a brain tumor. Patient states she became very upset and states her anxiety was extremely high. She called the neurologist office, and due to her anxiety the recommended she come to the ER for evaluation. Here the patient is calm and cooperative she is fairly anxious at times but overall calm and cooperative. Denies any weakness or numbness. Denies any headache. 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  Past Medical History:  Diagnosis Date  . Hypothyroidism   . Ovarian cyst     Patient Active Problem List   Diagnosis Date Noted  . Anxiety 10/04/2016  . Chest pain 10/03/2016  . 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 Hypothyroidism 10/03/2016    Past Surgical History:  Procedure Laterality Date  . BREAST BIOPSY Left 2012   NEG  . BREAST CYST ASPIRATION Left 2011    Prior to Admission medications   Medication Sig Start Date End Date Taking? Authorizing Provider  ALPRAZolam Duanne Moron) 0.25 MG tablet Take 0.25 mg by mouth daily as needed for anxiety.    [provider]  levETIRAcetam (KEPPRA) 500 MG tablet Take 1 tablet  (500 mg total) by mouth 2 (two) times daily. 10/05/16   Gladstone Lighter, MD  SYNTHROID 88 MCG tablet Take 88 mcg by mouth daily. 06/30/16   [provider]    No Known Allergies  Family History  Problem Relation Age of Onset  . Diabetes Mother   . CAD Father   . Breast cancer Neg Hx     Social History Social History  Substance Use Topics  . Smoking status: Former Research scientist (life sciences)  . Smokeless tobacco: Never Used  . Alcohol use 2.4 oz/week    4 Glasses of wine per week    Review of Systems Constitutional: Negative for fever. Cardiovascular: Negative for chest pain. Respiratory: Negative for shortness of breath. Gastrointestinal: Negative for abdominal pain Musculoskeletal: Negative for back pain. Neurological: Negative for headaches, focal weakness or numbness. All other ROS negative  ____________________________________________   PHYSICAL EXAM:  VITAL SIGNS: ED Triage Vitals  Enc Vitals Group     BP 10/05/16 1537 (!) 143/73     Pulse Rate 10/05/16 1536 86     Resp 10/05/16 1536 20     Temp 10/05/16 1536 97.6 F (36.4 C)     Temp Source 10/05/16 1536 Oral     SpO2 10/05/16 1536 97 %     Weight 10/05/16 1536 121 lb (54.9 kg)     Height 10/05/16 1536 5\' 2"  (1.575 m)     Head Circumference --      Peak Flow --      Pain Score  10/05/16 1738 0     Pain Loc --      Pain Edu? --      Excl. in [place]? --     Constitutional: Alert and oriented. Well appearing and in no distress. Eyes: Normal exam ENT   Head: Normocephalic and atraumatic.   Mouth/Throat: Mucous membranes are moist. Cardiovascular: Normal rate, regular rhythm.  Respiratory: Normal respiratory effort without tachypnea nor retractions. Breath sounds are clear Gastrointestinal: Soft and nontender. No distention. Musculoskeletal: Nontender with normal range of motion in all extremities.  Neurologic:  Normal speech and language. No gross focal neurologic deficits Skin:  Skin is warm, dry and intact.   Psychiatric: Mildly anxious appearing at times.  ____________________________________________     RADIOLOGY  MRI brain is negative IMPRESSION: Negative post infusion imaging. No abnormal enhancement in the brain with specific attention to the RIGHT temporal lobe.  Continued surveillance warranted. ____________________________________________   INITIAL IMPRESSION / ASSESSMENT AND PLAN / ED COURSE  Pertinent labs & imaging results that were available during my care of the patient were reviewed by me and considered in my medical decision making (see chart for details).  Patient presents to the emergency department for evaluation of possible brain tumor seen earlier on MRI. Patient states she is extremely anxious and cannot function knowing that she could possibly have a brain tumor so she called her neurologist and was referred back to the ER for evaluation. Here the patient appears overall well, mildly anxious but otherwise normal exam. Patient's MRI with contrast is negative, they specifically mention special attention paid to the right temporal lobe in no enhancement on MRI. This is very reassuring, this was discussed with the patient was very reassured and she will follow up with her neurologist as an outpatient  ____________________________________________   FINAL CLINICAL IMPRESSION(S) / ED DIAGNOSES  Abnormal MRI Normal physical examination    Harvest Dark, MD 10/05/16 1756

## 2016-10-05 NOTE — ED Triage Notes (Addendum)
FIRST NURSE NOTE-Sent by neurology for MRI brain r/o tumor. Ambulatory to desk without difficulty.

## 2016-10-11 ENCOUNTER — Emergency Department
Admission: EM | Admit: 2016-10-11 | Discharge: 2016-10-11 | Disposition: A | Discharge status: Home / Self Care | Payer: Commercial Managed Care - PPO | Attending: Emergency Medicine | Admitting: Emergency Medicine

## 2016-10-11 ENCOUNTER — Encounter: Payer: Self-pay | Admitting: Emergency Medicine

## 2016-10-11 DIAGNOSIS — Z87891 Personal history of nicotine dependence: Secondary | ICD-10-CM | POA: Diagnosis not present

## 2016-10-11 DIAGNOSIS — F419 Anxiety disorder, unspecified: Secondary | ICD-10-CM

## 2016-10-11 DIAGNOSIS — E039 Hypothyroidism, unspecified: Secondary | ICD-10-CM | POA: Insufficient documentation

## 2016-10-11 DIAGNOSIS — Z79899 Other long term (current) drug therapy: Secondary | ICD-10-CM | POA: Diagnosis not present

## 2016-10-11 HISTORY — DX: Anxiety disorder, unspecified: F41.9

## 2016-10-11 LAB — COMPREHENSIVE METABOLIC PANEL
ALBUMIN: 4 g/dL (ref 3.5–5.0)
ALK PHOS: 532 U/L — AB (ref 38–126)
ALT: 143 U/L — AB (ref 14–54)
AST: 96 U/L — AB (ref 15–41)
Anion gap: 7 (ref 5–15)
BUN: 11 mg/dL (ref 6–20)
CALCIUM: 9.5 mg/dL (ref 8.9–10.3)
CHLORIDE: 104 mmol/L (ref 101–111)
CO2: 27 mmol/L (ref 22–32)
CREATININE: 0.84 mg/dL (ref 0.44–1.00)
GFR calc non Af Amer: >60 mL/min (ref >60)
GLUCOSE: 113 mg/dL — AB (ref 65–99)
Potassium: 3.5 mmol/L (ref 3.5–5.1)
SODIUM: 138 mmol/L (ref 135–145)
Total Bilirubin: 0.5 mg/dL (ref 0.3–1.2)
Total Protein: 7.8 g/dL (ref 6.5–8.1)

## 2016-10-11 LAB — CBC
HEMATOCRIT: 40 % (ref 35.0–47.0)
HEMOGLOBIN: 13.8 g/dL (ref 12.0–16.0)
MCH: 32 pg (ref 26.0–34.0)
MCHC: 34.5 g/dL (ref 32.0–36.0)
MCV: 92.7 fL (ref 80.0–100.0)
Platelets: 260 10*3/uL (ref 150–440)
RBC: 4.32 MIL/uL (ref 3.80–5.20)
RDW: 13.5 % (ref 11.5–14.5)
WBC: 11 10*3/uL (ref 3.6–11.0)

## 2016-10-11 LAB — URINE DRUG SCREEN, QUALITATIVE (ARMC ONLY)
Amphetamines, Ur Screen: NOT DETECTED
Barbiturates, Ur Screen: NOT DETECTED
Benzodiazepine, Ur Scrn: NOT DETECTED
CANNABINOID 50 NG, UR ~~LOC~~: NOT DETECTED
COCAINE METABOLITE, UR ~~LOC~~: NOT DETECTED
MDMA (ECSTASY) UR SCREEN: NOT DETECTED
METHADONE SCREEN, URINE: NOT DETECTED
OPIATE, UR SCREEN: NOT DETECTED
Phencyclidine (PCP) Ur S: NOT DETECTED
TRICYCLIC, UR SCREEN: NOT DETECTED

## 2016-10-11 LAB — SALICYLATE LEVEL

## 2016-10-11 LAB — ACETAMINOPHEN LEVEL: Acetaminophen (Tylenol), Serum: <10 ug/mL — ABNORMAL LOW (ref 10–30)

## 2016-10-11 LAB — ETHANOL: Alcohol, Ethyl (B): <5 mg/dL (ref <5)

## 2016-10-11 NOTE — ED Triage Notes (Signed)
Patient is reporting spaces in time and is worried that she is not being told the full story regarding recent MRI's and she thoughts that she has a brain tumor, regardles of the MRI results saying otherwise.  Patient recently started taking Keppra last week d/t start up of seizures.

## 2016-10-11 NOTE — ED Provider Notes (Signed)
[facility] Emergency Department Provider Note   ____________________________________________   I have reviewed the triage vital signs and the nursing notes.   HISTORY  Chief Complaint Mental Health Problem   History limited by: Not Limited   HPI Margaret [name] is a 56 y.o. female who presents to the emergency department today with primary concerns for anxiety and panic attacks. Patient states that for the past couple weeks she has been quite anxious about the possibility of a brain tumor. She apparently had an admission here in the hospital was found to have seizures. Initial MR question possible brain tumor however then an MR with contrast did not show one. She continues to have some concerns and anxiety over this. States she had a bad panic attack today. Stated that she told her family she cannot come to the emergency department to get an answer she might hurt herself. She however denies any thoughts about wanting to harm herself. Denies any suicidal ideation.    Past Medical History:  Diagnosis Date  . Anxiety   . Hypothyroidism   . Ovarian cyst     Patient Active Problem List   Diagnosis Date Noted  . Anxiety 10/04/2016  . Chest pain 10/03/2016  . 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 Hypothyroidism 10/03/2016    Past Surgical History:  Procedure Laterality Date  . BREAST BIOPSY Left 2012   NEG  . BREAST CYST ASPIRATION Left 2011    Prior to Admission medications   Medication Sig Start Date End Date Taking? Authorizing Provider  ALPRAZolam Duanne Moron) 0.25 MG tablet Take 0.25 mg by mouth daily as needed for anxiety.   Yes [provider]  escitalopram (LEXAPRO) 10 MG tablet Take 10 mg by mouth daily. 10/03/16  Yes [provider]  levETIRAcetam (KEPPRA) 500 MG tablet Take 1 tablet (500 mg total) by mouth 2 (two) times daily. 10/05/16  Yes Gladstone Lighter, MD  SYNTHROID 88 MCG tablet Take 88 mcg by mouth daily. 06/30/16   Yes [provider]    Allergies Patient has no known allergies.  Family History  Problem Relation Age of Onset  . Diabetes Mother   . CAD Father   . Breast cancer Neg Hx     Social History Social History  Substance Use Topics  . Smoking status: Former Research scientist (life sciences)  . Smokeless tobacco: Never Used  . Alcohol use 2.4 oz/week    4 Glasses of wine per week    Review of Systems Constitutional: No fever/chills Eyes: No visual changes. ENT: No sore throat. Cardiovascular: Denies chest pain. Respiratory: Denies shortness of breath. Gastrointestinal: No abdominal pain.  No nausea, no vomiting.  No diarrhea.   Genitourinary: Negative for dysuria. Musculoskeletal: Negative for back pain. Skin: Negative for rash. Neurological: Positive for amnesiac episodes Psychiatric: Positive for anxiety. ____________________________________________   PHYSICAL EXAM:  VITAL SIGNS: ED Triage Vitals [10/11/16 2007]  Enc Vitals Group     BP 128/76     Pulse Rate 90     Resp 18     Temp 98.6 F (37 C)     Temp Source Oral     SpO2 99 %    Constitutional: Alert and oriented. Appears anxious. Eyes: Conjunctivae are normal.  ENT   Head: Normocephalic and atraumatic.   Nose: No congestion/rhinnorhea.   Mouth/Throat: Mucous membranes are moist.   Neck: No stridor. Hematological/Lymphatic/Immunilogical: No cervical lymphadenopathy. Cardiovascular: Normal rate, regular rhythm.  No murmurs, rubs, or gallops.  Respiratory: Normal respiratory  effort without tachypnea nor retractions. Breath sounds are clear and equal bilaterally. No wheezes/rales/rhonchi. Gastrointestinal: Soft and non tender. No rebound. No guarding.  Genitourinary: Deferred Musculoskeletal: Normal range of motion in all extremities. No lower extremity edema. Neurologic:  Normal speech and language. No gross focal neurologic deficits are appreciated.  Skin:  Skin is warm, dry and intact. No rash  noted. Psychiatric: Anxious.   ____________________________________________    LABS (pertinent positives/negatives)  Labs Reviewed  COMPREHENSIVE METABOLIC PANEL - Abnormal; Notable for the following:       Result Value   Glucose, Bld 113 (*)    AST 96 (*)    ALT 143 (*)    Alkaline Phosphatase 532 (*)    All other components within normal limits  ACETAMINOPHEN LEVEL - Abnormal; Notable for the following:    Acetaminophen (Tylenol), Serum <10 (*)    All other components within normal limits  ETHANOL  SALICYLATE LEVEL  CBC  URINE DRUG SCREEN, QUALITATIVE ([facility] ONLY)     ____________________________________________   EKG  None  ____________________________________________    RADIOLOGY  None  ____________________________________________   PROCEDURES  Procedures  ____________________________________________   INITIAL IMPRESSION / ASSESSMENT AND PLAN / ED COURSE  Pertinent labs & imaging results that were available during my care of the patient were reviewed by me and considered in my medical decision making (see chart for details).  Patient presented to the emergency department today with primary concerns for anxiety and panic attacks and possibly possible brain tumor. I reviewed them are sent at this point do not think patient has a brain tumor. I discussed this with the patient. I tried to answer all of her questions surrounding this. Felt that this helped alleviate some of the patient's anxiety. She does state she has a follow-up appointment with her neurologist Monday. Patient denies any thoughts of self-harm or suicidal ideation. At this point I do not feel patient meets criteria for IVC. Will discharge to follow up as an outpatient.  ____________________________________________   FINAL CLINICAL IMPRESSION(S) / ED DIAGNOSES  Final diagnoses:  Anxiety     Note: This dictation was prepared with Dragon dictation. Any transcriptional errors that result  from this process are unintentional     Nance Pear, MD 10/11/16 2322

## 2016-10-11 NOTE — ED Notes (Signed)
Pt and spouse taken to family room to discuss concerns. Spouse is concerned that pt will have another "episode" over the rest of the weekend and need up back in ED. This RN discussed with both that she should keep her follow up with neurologist in order to question medication.

## 2016-10-11 NOTE — ED Notes (Addendum)
Pt dressed out into wine colored scrubs and personal belongings in 1 bag... Consists of pants shirt, panties, bra, tennis shoes and socks.  Two yellow rings, one band and one with clear stone were GIVEN TO [place] in lobby at this time

## 2016-10-11 NOTE — ED Notes (Signed)
Patient to stat desk with family.  Family reports patient has been having panic attacks and stating she wanted to harm herself.

## 2016-10-11 NOTE — Discharge Instructions (Signed)
Please seek medical attention for any high fevers, chest pain, shortness of breath, change in behavior, persistent vomiting, bloody stool or any other new or concerning symptoms.  

## 2016-10-14 ENCOUNTER — Emergency Department
Admission: EM | Admit: 2016-10-14 | Discharge: 2016-10-14 | Disposition: A | Discharge status: Home / Self Care | Payer: Commercial Managed Care - PPO | Attending: Emergency Medicine | Admitting: Emergency Medicine

## 2016-10-14 ENCOUNTER — Encounter: Payer: Self-pay | Admitting: *Deleted

## 2016-10-14 DIAGNOSIS — F419 Anxiety disorder, unspecified: Secondary | ICD-10-CM | POA: Insufficient documentation

## 2016-10-14 DIAGNOSIS — R45851 Suicidal ideations: Secondary | ICD-10-CM | POA: Diagnosis not present

## 2016-10-14 DIAGNOSIS — E039 Hypothyroidism, unspecified: Secondary | ICD-10-CM | POA: Diagnosis not present

## 2016-10-14 DIAGNOSIS — Z79899 Other long term (current) drug therapy: Secondary | ICD-10-CM | POA: Insufficient documentation

## 2016-10-14 DIAGNOSIS — F4322 Adjustment disorder with anxiety: Secondary | ICD-10-CM

## 2016-10-14 DIAGNOSIS — Z87891 Personal history of nicotine dependence: Secondary | ICD-10-CM | POA: Insufficient documentation

## 2016-10-14 DIAGNOSIS — F69 Unspecified disorder of adult personality and behavior: Secondary | ICD-10-CM | POA: Diagnosis present

## 2016-10-14 LAB — URINE DRUG SCREEN, QUALITATIVE (ARMC ONLY)
AMPHETAMINES, UR SCREEN: NOT DETECTED
Barbiturates, Ur Screen: NOT DETECTED
Benzodiazepine, Ur Scrn: POSITIVE — AB
Cannabinoid 50 Ng, Ur ~~LOC~~: NOT DETECTED
Cocaine Metabolite,Ur ~~LOC~~: NOT DETECTED
MDMA (ECSTASY) UR SCREEN: NOT DETECTED
Methadone Scn, Ur: NOT DETECTED
Opiate, Ur Screen: NOT DETECTED
Phencyclidine (PCP) Ur S: NOT DETECTED
TRICYCLIC, UR SCREEN: NOT DETECTED

## 2016-10-14 LAB — CBC
HCT: 41.2 % (ref 35.0–47.0)
Hemoglobin: 14 g/dL (ref 12.0–16.0)
MCH: 31.1 pg (ref 26.0–34.0)
MCHC: 33.9 g/dL (ref 32.0–36.0)
MCV: 91.8 fL (ref 80.0–100.0)
PLATELETS: 261 10*3/uL (ref 150–440)
RBC: 4.49 MIL/uL (ref 3.80–5.20)
RDW: 13.9 % (ref 11.5–14.5)
WBC: 10.5 10*3/uL (ref 3.6–11.0)

## 2016-10-14 LAB — ACETAMINOPHEN LEVEL

## 2016-10-14 LAB — COMPREHENSIVE METABOLIC PANEL
ALT: 129 U/L — ABNORMAL HIGH (ref 14–54)
AST: 76 U/L — AB (ref 15–41)
Albumin: 4.1 g/dL (ref 3.5–5.0)
Alkaline Phosphatase: 499 U/L — ABNORMAL HIGH (ref 38–126)
Anion gap: 8 (ref 5–15)
BUN: 13 mg/dL (ref 6–20)
CHLORIDE: 103 mmol/L (ref 101–111)
CO2: 30 mmol/L (ref 22–32)
Calcium: 9.6 mg/dL (ref 8.9–10.3)
Creatinine, Ser: 0.97 mg/dL (ref 0.44–1.00)
Glucose, Bld: 96 mg/dL (ref 65–99)
POTASSIUM: 4.3 mmol/L (ref 3.5–5.1)
Sodium: 141 mmol/L (ref 135–145)
Total Bilirubin: 0.7 mg/dL (ref 0.3–1.2)
Total Protein: 7.9 g/dL (ref 6.5–8.1)

## 2016-10-14 LAB — SALICYLATE LEVEL

## 2016-10-14 LAB — ETHANOL

## 2016-10-14 NOTE — BH Assessment (Signed)
Per request of Dr.Clapacs, writer provided the pt. with information and instructions on how to access Outpatient Mental Health treatment.

## 2016-10-14 NOTE — ED Notes (Signed)

## 2016-10-14 NOTE — ED Triage Notes (Signed)
Pt has ambulatory to triage.  Pt reports she is SI and has lots of pills at home to do it.  Denies HI.  Denies etoh use or drug use.  Pt is calm and cooperative.

## 2016-10-14 NOTE — Consult Note (Signed)
[facility] Consult   Reason for Consult:  Consult for 56 year old woman who presented to the emergency room with acute anxiety specifically about her health Referring Physician:  Cinda Quest Patient Identification: JULISSA BROWNING MRN:  [id number] Principal Diagnosis: Adjustment disorder with anxiety Diagnosis:   Patient Active Problem List   Diagnosis Date Noted  . Adjustment disorder with anxiety [F43.22] 10/14/2016  . Anxiety [F41.9] 10/04/2016  . Chest pain [R07.9] 10/03/2016  . 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 Hypothyroidism [E03.9] 10/03/2016    Total Time spent with patient: 1 hour  Subjective:   NORINE REDDINGTON is a 56 y.o. female patient admitted with "I need to know that I don't have a brain tumor".  HPI:  Patient seen chart reviewed. Spoke with emergency room doctor and TTS. Reviewed her MRI scans. 56 year old woman reports that ever since her mother died several years ago she has been consumed with anxiety about her own health. She describes a pattern of always being panicky and anxious about every health problem that she has. This problem has gradually been getting worse. Over the last couple months she has been noticing spells of alteration of consciousness. She will describe these spells as being "out of it" for a short period of time. There was one fairly recently in which she actually said that she could not remember a period of time during which she had taken an exam. All of this was very concerning to her and so she decided to get a neurology workup. An additional MRI scan without contrast noted and enlargement of her right hippocampus. Follow-up contrast MRI was read as being negative without any clear abnormality with the contrast. Patient followed up with Dr. Melrose Nakayama for outpatient neurology follow-up yesterday. She reports that even though he told her that he did not think she had a brain tumor he wanted to see her back in 6 months. This  made her so anxious that she can't function today. Not able to go to work. Not able to think clearly. She reports that she had had some suicidal thoughts but not without any intention or plan or thought about harming herself. Mood is not described as being necessarily depressed so much is nervous and worried. Sleep interrupted which has been a chronic problem. Situations. She was recently tried on Lexapro for anxiety but it made her feel sick to her stomach so she discontinued it after only a few days. She has been prescribed 0.25 mg Xanax is by her primary care doctor and has taken a few of them to treat the anxiety with only slight benefit.  Social history: Patient is married has 2 adult children one of whom still lives at home. 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 Recent atypical spells of unclear etiology.  Substance abuse history: She says that she drinks 1 or 2 times a week only 1 or 2 drinks at a time and does not feel that it has ever been a problem. Denies use of any other drugs.  Past Psychiatric History: No history of psychiatric hospitalization no history of suicide attempts. Has never actually seen a psychiatrist in the past. Was prescribed Lexapro as her only antidepressant medicine but couldn't tolerate it. No history of violence or psychosis  Risk to Self: Is patient at risk for suicide?: Yes Risk to Others:   Prior Inpatient  Therapy:   Prior Outpatient Therapy:    Past Medical History:  Past Medical History:  Diagnosis Date  . Anxiety   . Hypothyroidism   . Ovarian cyst     Past Surgical History:  Procedure Laterality Date  . BREAST BIOPSY Left 2012   NEG  . BREAST CYST ASPIRATION Left 2011   Family History:  Family History  Problem Relation Age of Onset  . Diabetes Mother   . CAD Father   . Breast cancer Neg Hx    Family Psychiatric   History: No known family history Social History:  History  Alcohol Use  . 2.4 oz/week  . 4 Glasses of wine per week     History  Drug Use No    Social History   Social History  . Marital status: Married    Spouse name: N/A  . Number of children: N/A  . Years of education: N/A   Social History Main Topics  . Smoking status: Former Research scientist (life sciences)  . Smokeless tobacco: Never Used  . Alcohol use 2.4 oz/week    4 Glasses of wine per week  . Drug use: No  . Sexual activity: Not Asked   Other Topics Concern  . None   Social History Narrative  . None   Additional Social History:    Allergies:  No Known Allergies  Labs:  Results for orders placed or performed during the hospital encounter of 10/14/16 (from the past 48 hour(s))  Comprehensive metabolic panel     Status: Abnormal   Collection Time: 10/14/16  3:30 PM  Result Value Ref Range   Sodium 141 135 - 145 mmol/L   Potassium 4.3 3.5 - 5.1 mmol/L   Chloride 103 101 - 111 mmol/L   CO2 30 22 - 32 mmol/L   Glucose, Bld 96 65 - 99 mg/dL   BUN 13 6 - 20 mg/dL   Creatinine, Ser 0.97 0.44 - 1.00 mg/dL   Calcium 9.6 8.9 - 10.3 mg/dL   Total Protein 7.9 6.5 - 8.1 g/dL   Albumin 4.1 3.5 - 5.0 g/dL   AST 76 (H) 15 - 41 U/L   ALT 129 (H) 14 - 54 U/L   Alkaline Phosphatase 499 (H) 38 - 126 U/L   Total Bilirubin 0.7 0.3 - 1.2 mg/dL   GFR calc non Af Amer >60 >60 mL/min   GFR calc Af Amer >60 >60 mL/min    Comment: (NOTE) The eGFR has been calculated using the CKD EPI equation. This calculation has not been validated in all clinical situations. eGFR's persistently <60 mL/min signify possible Chronic Kidney Disease.    Anion gap 8 5 - 15  Ethanol     Status: None   Collection Time: 10/14/16  3:30 PM  Result Value Ref Range   Alcohol, Ethyl (B) <5 <5 mg/dL    Comment:        LOWEST DETECTABLE LIMIT FOR SERUM ALCOHOL IS 5 mg/dL FOR MEDICAL PURPOSES ONLY   Salicylate level     Status: None   Collection Time: 10/14/16  3:30  PM  Result Value Ref Range   Salicylate Lvl <1.7 2.8 - 30.0 mg/dL  Acetaminophen level     Status: Abnormal   Collection Time: 10/14/16  3:30 PM  Result Value Ref Range   Acetaminophen (Tylenol), Serum <10 (L) 10 - 30 ug/mL    Comment:        THERAPEUTIC CONCENTRATIONS VARY SIGNIFICANTLY. A RANGE OF 10-30 ug/mL MAY BE AN EFFECTIVE  CONCENTRATION FOR MANY PATIENTS. HOWEVER, SOME ARE BEST TREATED AT CONCENTRATIONS OUTSIDE THIS RANGE. ACETAMINOPHEN CONCENTRATIONS >150 ug/mL AT 4 HOURS AFTER INGESTION AND >50 ug/mL AT 12 HOURS AFTER INGESTION ARE OFTEN ASSOCIATED WITH TOXIC REACTIONS.   cbc     Status: None   Collection Time: 10/14/16  3:30 PM  Result Value Ref Range   WBC 10.5 3.6 - 11.0 K/uL   RBC 4.49 3.80 - 5.20 MIL/uL   Hemoglobin 14.0 12.0 - 16.0 g/dL   HCT 41.2 35.0 - 47.0 %   MCV 91.8 80.0 - 100.0 fL   MCH 31.1 26.0 - 34.0 pg   MCHC 33.9 32.0 - 36.0 g/dL   RDW 13.9 11.5 - 14.5 %   Platelets 261 150 - 440 K/uL  Urine Drug Screen, Qualitative     Status: Abnormal   Collection Time: 10/14/16  3:30 PM  Result Value Ref Range   Tricyclic, Ur Screen NONE DETECTED NONE DETECTED   Amphetamines, Ur Screen NONE DETECTED NONE DETECTED   MDMA (Ecstasy)Ur Screen NONE DETECTED NONE DETECTED   Cocaine Metabolite,Ur [place] NONE DETECTED NONE DETECTED   Opiate, Ur Screen NONE DETECTED NONE DETECTED   Phencyclidine (PCP) Ur S NONE DETECTED NONE DETECTED   Cannabinoid 50 Ng, Ur [place] NONE DETECTED NONE DETECTED   Barbiturates, Ur Screen NONE DETECTED NONE DETECTED   Benzodiazepine, Ur Scrn POSITIVE (A) NONE DETECTED   Methadone Scn, Ur NONE DETECTED NONE DETECTED    Comment: (NOTE) 962  Tricyclics, urine               Cutoff 1000 ng/mL 200  Amphetamines, urine             Cutoff 1000 ng/mL 300  MDMA (Ecstasy), urine           Cutoff 500 ng/mL 400  Cocaine Metabolite, urine       Cutoff 300 ng/mL 500  Opiate, urine                   Cutoff 300 ng/mL 600  Phencyclidine (PCP), urine       Cutoff 25 ng/mL 700  Cannabinoid, urine              Cutoff 50 ng/mL 800  Barbiturates, urine             Cutoff 200 ng/mL 900  Benzodiazepine, urine           Cutoff 200 ng/mL 1000 Methadone, urine                Cutoff 300 ng/mL 1100 1200 The urine drug screen provides only a preliminary, unconfirmed 1300 analytical test result and should not be used for non-medical 1400 purposes. Clinical consideration and professional judgment should 1500 be applied to any positive drug screen result due to possible 1600 interfering substances. A more specific alternate chemical method 1700 must be used in order to obtain a confirmed analytical result.  1800 Gas chromato graphy / mass spectrometry (GC/MS) is the preferred 1900 confirmatory method.     No current facility-administered medications for this encounter.    Current Outpatient Prescriptions  Medication Sig Dispense Refill  . ALPRAZolam (XANAX) 0.25 MG tablet Take 0.25 mg by mouth daily as needed for anxiety.    . divalproex (DEPAKOTE) 250 MG DR tablet Take 250-500 mg by mouth See admin instructions. Take 250 mg by mouth twice daily for 3 days, then take 500 mg by mouth twice daily.  0  . escitalopram (LEXAPRO)  10 MG tablet Take 10 mg by mouth daily.  0  . levETIRAcetam (KEPPRA) 500 MG tablet Take 1 tablet (500 mg total) by mouth 2 (two) times daily. 60 tablet 2  . QUEtiapine (SEROQUEL) 50 MG tablet Take 25-50 mg by mouth 2 (two) times daily. Take 25 mg by mouth in the morning and take 50 mg by mouth every evening.  0  . SYNTHROID 88 MCG tablet Take 88 mcg by mouth daily.  0    Musculoskeletal: Strength & Muscle Tone: within normal limits Gait & Station: normal Patient leans: N/A  Psychiatric Specialty Exam: Physical Exam  Nursing note and vitals reviewed. Constitutional: She appears well-developed and well-nourished.  HENT:  Head: Normocephalic and atraumatic.  Eyes: Pupils are equal, round, and reactive to light. Conjunctivae  are normal.  Neck: Normal range of motion.  Cardiovascular: Regular rhythm and normal heart sounds.   Respiratory: Effort normal.  GI: Soft.  Musculoskeletal: Normal range of motion.  Neurological: She is alert.  Skin: Skin is warm and dry.  Psychiatric: Her speech is normal and behavior is normal. Her mood appears anxious. Thought content is not paranoid and not delusional. Cognition and memory are normal. She expresses inappropriate judgment. She expresses no homicidal and no suicidal ideation.    Review of Systems  Constitutional: Negative.   HENT: Negative.   Eyes: Negative.   Respiratory: Negative.   Cardiovascular: Negative.   Gastrointestinal: Negative.   Musculoskeletal: Negative.   Skin: Negative.   Neurological: Negative.   Psychiatric/Behavioral: Negative for depression, hallucinations, memory loss, substance abuse and suicidal ideas. The patient is nervous/anxious and has insomnia.     Blood pressure 115/80, pulse 89, temperature 98.5 F (36.9 C), temperature source Oral, resp. rate 20, height 5' 2"  (1.575 m), weight 54.9 kg (121 lb), SpO2 99 %.Body mass index is 22.13 kg/m.  General Appearance: Casual  Eye Contact:  Fair  Speech:  Clear and Coherent  Volume:  Normal  Mood:  Anxious  Affect:  Congruent  Thought Process:  Goal Directed  Orientation:  Full (Time, Place, and Person)  Thought Content:  Rumination  Suicidal Thoughts:  No  Homicidal Thoughts:  No  Memory:  Immediate;   Good Recent;   Good Remote;   Fair  Judgement:  Fair  Insight:  Fair  Psychomotor Activity:  Normal  Concentration:  Concentration: Fair  Recall:  [employer] of Knowledge:  Fair  Language:  Fair  Akathisia:  No  Handed:  Right  AIMS (if indicated):     Assets:  Communication Skills Desire for Improvement Housing Physical Health Resilience Social Support  ADL's:  Intact  Cognition:  WNL  Sleep:        Treatment Plan Summary: Plan Patient with severe anxiety but no  psychosis and does not clearly meet criteria for depression. Suicidal statements were clarified as being only "if I was going to die of a brain tumor". Spent some time with the patient and the emergency room physician clarifying to her that there was nothing on her MRI scan to suggest a definite diagnosis and that there was certainly no reason for her to think that she was going to die of a brain tumor. Patient there after clarified that she was not going to harm herself. Does not need inpatient treatment. Could not tolerate Lexapro so I'm not trying to add a new medicine. I support her decision to not overuse the Xanax. Strongly encourage her to see a psychotherapist for management of  her anxiety disorder.  Disposition: Patient does not meet criteria for psychiatric inpatient admission. Supportive therapy provided about ongoing stressors.  Alethia Berthold, MD 10/14/2016 4:44 PM

## 2016-10-14 NOTE — Discharge Instructions (Signed)
Please follow-up with the counselor as suggested by Dr. Carlena Hurl. Please return here for any further problems. What you had wasn't particularly a panic attack but I do not have instructions for anxiety that would pertain to your specific case. This is the closest the computer-generated instructions come.

## 2016-10-14 NOTE — ED Provider Notes (Signed)
[facility] Emergency Department Provider Note   ____________________________________________   First MD Initiated Contact with Patient 10/14/16 1556     (approximate)  I have reviewed the triage vital signs and the nursing notes.   HISTORY  Chief Complaint Behavior Problem and Suicidal    HPI Margaret [name] is a 56 y.o. female who comes in very anxious about the possibility of brain tumor. She says if she has a brain tumor she will kill herself. However after reviewing her MRI reports and the pictures with her and Dr. Lissa Hoard packs and reviewing Dr. Lannie Fields last note where he does not mention anything about that it is apparent that she does not have any brain tumor that was seen. She does have follow-up with Dr. Melrose Nakayama as she reports. She is not having any symptoms at present. She feels very reassured and contracts for safety. Dr. Lissa Hoard packs will arrange therapy for her as an outpatient for her anxiety.   Past Medical History:  Diagnosis Date  . Anxiety   . Hypothyroidism   . Ovarian cyst     Patient Active Problem List   Diagnosis Date Noted  . Adjustment disorder with anxiety 10/14/2016  . Anxiety 10/04/2016  . Chest pain 10/03/2016  . 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 Hypothyroidism 10/03/2016    Past Surgical History:  Procedure Laterality Date  . BREAST BIOPSY Left 2012   NEG  . BREAST CYST ASPIRATION Left 2011    Prior to Admission medications   Medication Sig Start Date End Date Taking? Authorizing Provider  ALPRAZolam Duanne Moron) 0.25 MG tablet Take 0.25 mg by mouth daily as needed for anxiety.   Yes [provider]  divalproex (DEPAKOTE) 250 MG DR tablet Take 250-500 mg by mouth See admin instructions. Take 250 mg by mouth twice daily for 3 days, then take 500 mg by mouth twice daily. 10/13/16  Yes [provider]  escitalopram (LEXAPRO) 10 MG tablet Take 10 mg by mouth daily. 10/03/16  Yes [provider]  levETIRAcetam (KEPPRA) 500 MG tablet Take 1 tablet (500 mg total) by mouth 2 (two) times daily. 10/05/16  Yes Gladstone Lighter, MD  QUEtiapine (SEROQUEL) 50 MG tablet Take 25-50 mg by mouth 2 (two) times daily. Take 25 mg by mouth in the morning and take 50 mg by mouth every evening. 10/13/16  Yes [provider]  SYNTHROID 88 MCG tablet Take 88 mcg by mouth daily. 06/30/16  Yes [provider]    Allergies Patient has no known allergies.  Family History  Problem Relation Age of Onset  . Diabetes Mother   . CAD Father   . Breast cancer Neg Hx     Social History Social History  Substance Use Topics  . Smoking status: Former Research scientist (life sciences)  . Smokeless tobacco: Never Used  . Alcohol use 2.4 oz/week    4 Glasses of wine per week    Review of Systems *} Constitutional: No fever/chills Eyes: No visual changes. ENT: No sore throat. Cardiovascular: Denies chest pain. Respiratory: Denies shortness of breath. Gastrointestinal: No abdominal pain.  No nausea, no vomiting.  No diarrhea.  No constipation. Genitourinary: Negative for dysuria. Musculoskeletal: Negative for back pain. Skin: Negative for rash. Neurological: Negative for headaches, focal weakness or numbness.   ____________________________________________   PHYSICAL EXAM:  VITAL SIGNS: ED Triage Vitals  Enc Vitals Group     BP 10/14/16 1525 115/80     Pulse Rate 10/14/16 1525 89  Resp 10/14/16 1525 20     Temp 10/14/16 1525 98.5 F (36.9 C)     Temp Source 10/14/16 1525 Oral     SpO2 10/14/16 1525 99 %     Weight 10/14/16 1527 121 lb (54.9 kg)     Height 10/14/16 1527 5\' 2"  (1.575 m)     Head Circumference --      Peak Flow --      Pain Score 10/14/16 1525 0     Pain Loc --      Pain Edu? --      Excl. in [place]? --     Constitutional: Alert and oriented. Well appearing and in no acute distress. Eyes: Conjunctivae are normal. PERRL. EOMI. Head: Atraumatic. Nose: No  congestion/rhinnorhea. Mouth/Throat: Mucous membranes are moist.  Oropharynx non-erythematous. Neck: No stridor.Cardiovascular: Normal rate, regular rhythm. Kermit Balo peripheral circulation. Respiratory: Normal respiratory effort.  No retractions.  Gastrointestinal: Soft and nontender. No distention. No abdominal bruits.  Musculoskeletal: No lower extremity tenderness nor edema. Neurologic:  Normal speech and language. No gross focal neurologic deficits are appreciated. No gait instability. Skin:  Skin is warm, dry and intact. No rash noted. Psychiatric: Mood and affect are normal. Speech and behavior are normal.  ____________________________________________   LABS (all labs ordered are listed, but only abnormal results are displayed)  Labs Reviewed  COMPREHENSIVE METABOLIC PANEL - Abnormal; Notable for the following:       Result Value   AST 76 (*)    ALT 129 (*)    Alkaline Phosphatase 499 (*)    All other components within normal limits  ACETAMINOPHEN LEVEL - Abnormal; Notable for the following:    Acetaminophen (Tylenol), Serum <10 (*)    All other components within normal limits  URINE DRUG SCREEN, QUALITATIVE ([facility] ONLY) - Abnormal; Notable for the following:    Benzodiazepine, Ur Scrn POSITIVE (*)    All other components within normal limits  ETHANOL  SALICYLATE LEVEL  CBC   ____________________________________________  EKG   ____________________________________________  RADIOLOGY  ____________________________________________   PROCEDURES  Procedure(s) performed  Procedures  Critical Care performed:   ____________________________________________   INITIAL IMPRESSION / ASSESSMENT AND PLAN / ED COURSE  Pertinent labs & imaging results that were available during my care of the patient were reviewed by me and considered in my medical decision making (see chart for details).        ____________________________________________   FINAL CLINICAL  IMPRESSION(S) / ED DIAGNOSES  Final diagnoses:  Anxiety      NEW MEDICATIONS STARTED DURING THIS VISIT:  New Prescriptions   No medications on file     Note:  This document was prepared using Dragon voice recognition software and may include unintentional dictation errors.    Nena Polio, MD 10/14/16 [phone]

## 2016-11-14 ENCOUNTER — Ambulatory Visit
Admission: RE | Admit: 2016-11-14 | Discharge: 2016-11-14 | Disposition: A | Discharge status: Home / Self Care | Payer: Commercial Managed Care - PPO | Source: Ambulatory Visit | Attending: Obstetrics and Gynecology | Admitting: Obstetrics and Gynecology

## 2016-11-14 DIAGNOSIS — Z1231 Encounter for screening mammogram for malignant neoplasm of breast: Secondary | ICD-10-CM | POA: Insufficient documentation

## 2017-01-28 ENCOUNTER — Ambulatory Visit: Payer: Commercial Managed Care - PPO | Admitting: Psychiatry

## 2017-02-16 ENCOUNTER — Ambulatory Visit: Payer: Self-pay | Admitting: Psychiatry

## 2017-03-05 ENCOUNTER — Encounter: Payer: Self-pay | Admitting: Psychiatry

## 2017-03-05 ENCOUNTER — Ambulatory Visit: Payer: BLUE CROSS/BLUE SHIELD | Admitting: Psychiatry

## 2017-03-05 ENCOUNTER — Other Ambulatory Visit: Payer: Self-pay

## 2017-03-05 VITALS — BP 134/75 | HR 76 | Temp 97.6°F | Wt 138.8 lb

## 2017-03-05 DIAGNOSIS — F411 Generalized anxiety disorder: Secondary | ICD-10-CM

## 2017-03-05 MED ORDER — FLUOXETINE HCL 20 MG PO TABS: 20.0000 mg | ORAL_TABLET | Freq: Every day | ORAL | 2 refills | Status: DC

## 2017-03-05 MED ORDER — HYDROXYZINE PAMOATE 25 MG PO CAPS: 25.0000 mg | ORAL_CAPSULE | Freq: Three times a day (TID) | ORAL | 1 refills | Status: DC | PRN

## 2017-03-05 MED ORDER — QUETIAPINE FUMARATE 25 MG PO TABS: 25.0000 mg | ORAL_TABLET | Freq: Every day | ORAL | 2 refills | Status: DC

## 2017-03-05 NOTE — Progress Notes (Signed)
Psychiatric Initial Adult Assessment   Patient Identification: Margaret [name] MRN:  [id number] Date of Evaluation:  03/06/2017 Referral Source: Sharin Mons MD Chief Complaint:  ' I am anxious.'  Chief Complaint    Establish Care; Anxiety     Visit Diagnosis:    ICD-10-CM   1. GAD (generalized anxiety disorder) F41.1 QUEtiapine (SEROQUEL) 25 MG tablet    FLUoxetine (PROZAC) 20 MG tablet    hydrOXYzine (VISTARIL) 25 MG capsule    History of Present Illness: Margaret [name] is a 56 year old Caucasian female who is married, employed, lives in [place], has a history of anxiety disorder who presented to the clinic today to establish care.  [name] reports that her anxiety symptoms has been getting worse since the past few months.  She reports that in July she had some seizure-like spells and she went to the emergency department where she had multiple imaging done of her brain.  She reports that she may have lived with seizures all her life but never was diagnosed.  She reports that they started her on medications to manage her seizures, but she reports that they wanted to find out why she had seizures and had MRIs of her brain done. One of the MRI showed that she may have brain tumor and hence an MRI with contrast was done the next day.  She reports that even though the second MRI did not show any brain tumor she is supposed to follow-up with her doctor in like 3-4 months to reevaluate.  She reports that ever since then she has been very anxious about her health.  She constantly worries that she may have brain tumor.  She reports she spent hours googling/researching about her health problems and also about brain tumor and she became more and more anxious.  She reports she had a breakdown at the neurology office after all this happened and at that time she was started on Seroquel to help her calm down.  She reports she continues to take it on a regular basis.  She was also started on Xanax 6 years ago for  anxiety symptoms by her PMD.  She reports that after her mother died she started having these crying spells and anxiety attacks and she takes 1 Xanax to calm her down as needed.  She reports she does not take it every day .  She reports that 90 Xanax pill will last her for a year or so.  Margaret [name] reports feeling sad about her health problems.  She does have some crying spells on and off.  She denies any suicidality or homicidality.  She reports her sleep is okay.  She denies any perceptual disturbances.  She does reports having increased appetite from being on Depakote for her seizures.  She reports she was started on Keppra initially but she had a bad reaction to it and was started on Depakote.  She reports that the Depakote makes her drowsy during the day and also causing her to have hair loss.  Her provider reduced her Depakote dose from 1000-750 but she continues to struggle with hair loss issues.  [name] has a supportive family.  She is happily married.  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  She has 2 children who are adults and she has a good relationship with both of them.  Associated Signs/Symptoms: Depression Symptoms:  depressed mood, difficulty concentrating, anxiety, (Hypo) Manic Symptoms:  denies Anxiety Symptoms:  Excessive Worry, Psychotic Symptoms:  denies PTSD Symptoms: Negative  Past Psychiatric History: Reports history of anxiety symptoms since the past several years.  Her anxiety symptoms worsened after her recent diagnosis of seizure disorder.  She denies any suicide attempts.  She denies any past inpatient admissions.  Previous Psychotropic Medications: Yes , lexapro  Substance Abuse History in the last 12 months:  No.  Consequences of Substance Abuse: Negative  Past Medical History:  Past Medical History:  Diagnosis Date  . Anxiety   . Epilepsy ([place])   . Hypothyroidism   . Ovarian cyst     Past Surgical History:   Procedure Laterality Date  . BREAST BIOPSY Left 2012   NEG  . BREAST CYST ASPIRATION Left 2011  . OVARY SURGERY      Family Psychiatric History: Reports her father may have had anxiety disorder.  Family History:  Family History  Problem Relation Age of Onset  . Diabetes Mother   . CAD Father   . Breast cancer Neg Hx     Social History:   Social History   Socioeconomic History  . Marital status: Married    Spouse name: Margaret [name]  . Number of children: 2  . Years of education: None  . Highest education level: High school graduate  Social Needs  . Financial resource strain: Not hard at all  . Food insecurity - worry: Never true  . Food insecurity - inability: Never true  . Transportation needs - medical: No  . Transportation needs - non-medical: No  Occupational History    Comment: part time  Tobacco Use  . Smoking status: Former Smoker    Types: Cigarettes    Last attempt to quit: 03/06/2011    Years since quitting: 6.0  . Smokeless tobacco: Never Used  Substance and Sexual Activity  . Alcohol use: Yes    Alcohol/week: 4.8 oz    Types: 4 Glasses of wine, 4 Shots of liquor per week  . Drug use: No  . Sexual activity: Yes    Birth control/protection: None  Other Topics Concern  . None  Social History Narrative  . None    Additional Social History: Margaret [name] is married, employed as a Systems developer at [employer].  She lives in [place].  She has 2 adult children.  Allergies:  No Known Allergies  Metabolic Disorder Labs: No results found for: HGBA1C, MPG No results found for: PROLACTIN No results found for: CHOL, TRIG, HDL, CHOLHDL, VLDL, LDLCALC   Current Medications: Current Outpatient Medications  Medication Sig Dispense Refill  . ALPRAZolam (XANAX) 0.25 MG tablet Take 0.25 mg by mouth daily as needed for anxiety.    . divalproex (DEPAKOTE) 250 MG DR tablet Take 250-500 mg by mouth See admin instructions. Take 250 mg by mouth twice  daily for 3 days, then take 500 mg by mouth twice daily.  0  . SYNTHROID 88 MCG tablet Take 88 mcg by mouth daily.  0  . FLUoxetine (PROZAC) 20 MG tablet Take 1 tablet (20 mg total) by mouth daily with breakfast. 30 tablet 2  . hydrOXYzine (VISTARIL) 25 MG capsule Take 1 capsule (25 mg total) by mouth 3 (three) times daily as needed for anxiety. 90 capsule 1  . QUEtiapine (SEROQUEL) 25 MG tablet Take 1 tablet (25 mg total) by mouth at bedtime. 30 tablet 2   No current facility-administered medications for this visit.     Neurologic: Headache: No Seizure: Yes Paresthesias:No  Musculoskeletal: Strength & Muscle Tone: within normal limits  Gait & Station: normal Patient leans: N/A  Psychiatric Specialty Exam: Review of Systems  Psychiatric/Behavioral: The patient is nervous/anxious.   All other systems reviewed and are negative.   Blood pressure 134/75, pulse 76, temperature 97.6 F (36.4 C), temperature source Oral, weight 138 lb 12.8 oz (63 kg).Body mass index is 25.39 kg/m.  General Appearance: Casual  Eye Contact:  Fair  Speech:  Normal Rate  Volume:  Normal  Mood:  Anxious and Dysphoric  Affect:  Tearful  Thought Process:  Goal Directed and Descriptions of Associations: Intact  Orientation:  Full (Time, Place, and Person)  Thought Content:  Logical  Suicidal Thoughts:  No  Homicidal Thoughts:  No  Memory:  Immediate;   Fair Recent;   Fair Remote;   Fair  Judgement:  Fair  Insight:  Fair  Psychomotor Activity:  Normal  Concentration:  Concentration: Fair and Attention Span: Fair  Recall:  [employer] of Knowledge:Fair  Language: Fair  Akathisia:  No  Handed:  Right  AIMS (if indicated):  Denies side effects of tremors , rigidity  Assets:  Communication Skills Desire for Improvement Financial Resources/Insurance Housing Social Support Talents/Skills Transportation Vocational/Educational  ADL's:  Intact  Cognition: WNL  Sleep:  fair    Treatment Plan  Summary: Quinette is a 56 year old Caucasian female who has a history of anxiety disorder, seizure disorder, hypothyroidism who presented to the clinic today to establish care.  Tesia reports that she currently is anxious and worries a lot about having a brain tumor due to her recent seizure disorder. She denies any sleep issues.  She is motivated to get psychotherapy.  She denies any substance abuse problems.  She denies any suicidality.  She does have positive family history of mental health issues.  She is a good candidate for outpatient treatment. Medication management and Plan see below   Plan  For anxiety Start Prozac 20 mg p.o. daily Discussed to taper off his Xanax gradually.  She reports she does not use it every day.  Discussed the risk of being on benzodiazepines. Will give Vistaril 25 mg 3 times daily as needed for anxiety attacks. Also will refer her for psychotherapy with Ms. Peacock here in clinic. Will reduce Seroquel XR  to Seroquel 25 mg p.o. nightly.  Patient reports some drowsiness on the Depakote that she takes during the day.  Discussed with patient to follow-up with her neurologist regarding the same.  We will also forward today's note to neurology.  Request labs as like TSH, hemoglobin A1c, lipid panel, prolactin if not already done.  Reviewed medical records in EHR including Dr. Prescott Gum notes.  Follow-up in clinic in 4 weeks or sooner if needed.  More than 50 % of the time was spent for psychoeducation and supportive psychotherapy and care coordination.  This note was generated in part or whole with voice recognition software. Voice recognition is usually quite accurate but there are transcription errors that can and very often do occur. I apologize for any typographical errors that were not detected and corrected.        Ursula Alert, MD 12/28/20189:01 AM

## 2017-03-06 ENCOUNTER — Encounter: Payer: Self-pay | Admitting: Psychiatry

## 2017-03-24 ENCOUNTER — Encounter: Payer: Self-pay | Admitting: Psychiatry

## 2017-03-24 ENCOUNTER — Ambulatory Visit: Payer: BLUE CROSS/BLUE SHIELD | Admitting: Psychiatry

## 2017-03-24 ENCOUNTER — Other Ambulatory Visit: Payer: Self-pay

## 2017-03-24 ENCOUNTER — Ambulatory Visit (INDEPENDENT_AMBULATORY_CARE_PROVIDER_SITE_OTHER): Payer: BLUE CROSS/BLUE SHIELD | Admitting: Licensed Clinical Social Worker

## 2017-03-24 VITALS — BP 117/77 | HR 75 | Temp 97.6°F | Wt 138.4 lb

## 2017-03-24 DIAGNOSIS — F411 Generalized anxiety disorder: Secondary | ICD-10-CM

## 2017-03-24 NOTE — Progress Notes (Signed)
[facility] MD OP Progress Note  03/24/2017 5:02 PM Margaret [name]  MRN:  [id number]  Chief Complaint: ' I am ok."  Chief Complaint    Follow-up; Medication Refill     HPI: Margaret [name] is a 57 year old Caucasian female who is married, employed, lives in [place], has a history of anxiety disorder, presented to the clinic today for a follow-up visit.  Margaret [name] today reports that she is currently doing well with regards to her anxiety symptoms.  She reports she has been compliant with her Prozac 20 mg.  She reports initially she had some GI side effects.  But that subsided after she took it for a few days.  She reports that she can feel that her anxiety and agitation has come down and she feels much better.  She continues to be on the Depakote for her seizure disorder.  She is kind of worried about her upcoming follow-up for her brain tumor evaluation which is coming up in April.  She however reports that she has been able to cope with her anxiety symptoms and is not too occupied with that anymore.  She reports she was able to stop the Xanax.  She reports she also has not started the hydroxyzine which was prescribed to her for breakthrough anxiety.  She reports her sleep is okay.  She reports she met with Ms. Peacock for her CBT appointment.  And it went well.  She continues to want to follow up with her on a regular basis.  She continues to have good social support from her husband as well as her children.  Her son who is in [place] came to visit her over the holidays.  Her daughter just celebrated her 23rd birthday.  She reports she looks forward to the vacation that she is going to take with her husband and her children and also other family members in February.  They are planning to go to [place] and she is excited about that.  She reports work is going well.  She denies any other concerns at this time. Visit Diagnosis:    ICD-10-CM   1. GAD (generalized anxiety disorder) F41.1     Past Psychiatric  History: Reports  history of anxiety symptoms since the past several years.  Her anxiety symptoms worsened after her recent diagnosis of seizure disorder.  She denies any suicide attempts.  She denies any past inpatient admissions.   Past Medical History:  Past Medical History:  Diagnosis Date  . Anxiety   . Epilepsy ([place])   . Hypothyroidism   . Ovarian cyst     Past Surgical History:  Procedure Laterality Date  . BREAST BIOPSY Left 2012   NEG  . BREAST CYST ASPIRATION Left 2011  . OVARY SURGERY      Family Psychiatric History: Reports her father may have had anxiety disorder.  Family History:  Family History  Problem Relation Age of Onset  . Diabetes Mother   . CAD Father   . Breast cancer Neg Hx    Substance abuse history: Denies   Social History: Margaret [name] is married, employed as a Systems developer at [employer].  She lives in [place].  She has 2 adult children. Social History   Socioeconomic History  . Marital status: Married    Spouse name: hubert  . Number of children: 2  . Years of education: None  . Highest education level: High school graduate  Social Needs  . Financial resource strain: Not hard at all  .  Food insecurity - worry: Never true  . Food insecurity - inability: Never true  . Transportation needs - medical: No  . Transportation needs - non-medical: No  Occupational History    Comment: part time  Tobacco Use  . Smoking status: Former Smoker    Types: Cigarettes    Last attempt to quit: 03/06/2011    Years since quitting: 6.0  . Smokeless tobacco: Never Used  Substance and Sexual Activity  . Alcohol use: Yes    Alcohol/week: 4.8 oz    Types: 4 Glasses of wine, 4 Shots of liquor per week  . Drug use: No  . Sexual activity: Yes    Birth control/protection: None  Other Topics Concern  . None  Social History Narrative  . None    Allergies: No Known Allergies  Metabolic Disorder Labs: No results found for: HGBA1C,  MPG No results found for: PROLACTIN No results found for: CHOL, TRIG, HDL, CHOLHDL, VLDL, LDLCALC Lab Results  Component Value Date   TSH 1.514 10/04/2016    Therapeutic Level Labs: No results found for: LITHIUM No results found for: VALPROATE No components found for:  CBMZ  Current Medications: Current Outpatient Medications  Medication Sig Dispense Refill  . divalproex (DEPAKOTE) 250 MG DR tablet Take 250-500 mg by mouth See admin instructions. Take 250 mg by mouth twice daily for 3 days, then take 500 mg by mouth twice daily.  0  . FLUoxetine (PROZAC) 20 MG tablet Take 1 tablet (20 mg total) by mouth daily with breakfast. 30 tablet 2  . hydrOXYzine (VISTARIL) 25 MG capsule Take 1 capsule (25 mg total) by mouth 3 (three) times daily as needed for anxiety. 90 capsule 1  . QUEtiapine (SEROQUEL) 25 MG tablet Take 1 tablet (25 mg total) by mouth at bedtime. 30 tablet 2  . SYNTHROID 88 MCG tablet Take 88 mcg by mouth daily.  0   No current facility-administered medications for this visit.      Musculoskeletal: Strength & Muscle Tone: within normal limits Gait & Station: normal Patient leans: N/A  Psychiatric Specialty Exam: Review of Systems  Psychiatric/Behavioral: The patient is nervous/anxious.   All other systems reviewed and are negative.   Blood pressure 117/77, pulse 75, temperature 97.6 F (36.4 C), temperature source Oral, weight 138 lb 6.4 oz (62.8 kg).Body mass index is 25.31 kg/m.  General Appearance: Casual  Eye Contact:  Fair  Speech:  Normal Rate  Volume:  Normal  Mood:  Anxious  Affect:  Congruent  Thought Process:  Goal Directed and Descriptions of Associations: Intact  Orientation:  Full (Time, Place, and Person)  Thought Content: Logical   Suicidal Thoughts:  No  Homicidal Thoughts:  No  Memory:  Immediate;   Fair Recent;   Fair Remote;   Fair  Judgement:  Fair  Insight:  Fair  Psychomotor Activity:  Normal  Concentration:  Concentration: Fair  and Attention Span: Fair  Recall:  [employer] of Knowledge: Fair  Language: Fair  Akathisia:  No  Handed:  Right  AIMS (if indicated): 0  Assets:  Communication Skills Desire for Improvement Financial Resources/Insurance Housing Intimacy Leisure Time Social Support Talents/Skills Transportation  ADL's:  Intact  Cognition: WNL  Sleep:  Fair   Screenings:GAD - 1   Assessment and Plan: Aashka is a 57 year old Caucasian female who has a history of anxiety disorder, seizure disorder, hypothyroidism who presented to the clinic today for a follow-up visit.  Kelsa reports that she continues to worry  about having the R/O  brain tumor evaluations as well as her recent seizure disorder but her anxiety symptoms are under control now.  She denies any other issues.  She is compliant with her medication and is motivated to continue CBT with Ms. Peacock here in clinic.  She continues to be a good candidate for outpatient treatment.  Plan as noted below.  Plan  Anxiety Continue Prozac 20 mg p.o. daily She was able to taper herself off of the Xanax without much problems. Continue hydroxyzine 25 mg p.o. 2 times daily as needed for anxiety attacks.  She however reports she did not need it yet. Continue Seroquel 25 mg p.o. nightly. Continue CBT with Ms. Royal Piedra. GAD 7 equals 1 AIMS = 0  Provided lab slip to get TSH, hemoglobin A1c, lipid panel, prolactin.  She reports she will get it from her primary medical doctor.   Follow-up in 6-8 weeks or sooner if needed  More than 50 % of the time was spent for psychoeducation and supportive psychotherapy and care coordination.  This note was generated in part or whole with voice recognition software. Voice recognition is usually quite accurate but there are transcription errors that can and very often do occur. I apologize for any typographical errors that were not detected and corrected.     Ursula Alert, MD 03/24/2017, 5:02 PM

## 2017-03-24 NOTE — Progress Notes (Signed)
Comprehensive Clinical Assessment (CCA) Note  03/24/2017 Margaret [name] [id number]  Visit Diagnosis:      ICD-10-CM   1. GAD (generalized anxiety disorder) F41.1       CCA Part One  Part One has been completed on paper by the patient.  (See scanned document in Chart Review)  CCA Part Two A  Intake/Chief Complaint:  CCA Intake With Chief Complaint CCA Part Two Date: 03/24/17 CCA Part Two Time: 1403 Chief Complaint/Presenting Problem: The Psychiatrist said I needed to see my Psychiatrist. Patients Currently Reported Symptoms/Problems: I have had anxiety a very long time.  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  I had 6 doctors tell me that I did not have a brain tumor.  I needed all those doctors to tell me this; I was convinced.  I worry about everything.  My husband & I went to [place] and I thought something bad was going to happen since my daughter could not go. I was taken off of Xanax and now taking Prozac.  I am calmer since the medication change. I took one whole day looking up my MRI scan. Individual's Strengths: good speller, good mom, reading Individual's Preferences: anxiety and what I put my family through back in August 2018 Individual's Abilities: communicates well Type of Services Patient Feels Are Needed: therapy, medication management  Mental Health Symptoms Depression:  Depression: Tearfulness  Mania:  Mania: N/A  Anxiety:   Anxiety: Worrying, Tension, Irritability, Fatigue, Difficulty concentrating, Sleep  Psychosis:  Psychosis: N/A  Trauma:  Trauma: N/A  Obsessions:  Obsessions: N/A  Compulsions:  Compulsions: N/A  Inattention:  Inattention: N/A  Hyperactivity/Impulsivity:  Hyperactivity/Impulsivity: N/A  Oppositional/Defiant Behaviors:  Oppositional/Defiant Behaviors: N/A  Borderline Personality:  Emotional Irregularity: N/A  Other Mood/Personality Symptoms:      Mental Status Exam Appearance and self-care  Stature:  Stature: Average   Weight:  Weight: Average weight  Clothing:  Clothing: Neat/clean  Grooming:  Grooming: Normal  Cosmetic use:  Cosmetic Use: None  Posture/gait:  Posture/Gait: Normal  Motor activity:  Motor Activity: Not Remarkable  Sensorium  Attention:  Attention: Normal  Concentration:  Concentration: Normal  Orientation:  Orientation: X5  Recall/memory:  Recall/Memory: Normal  Affect and Mood  Affect:  Affect: Appropriate  Mood:  Mood: Euthymic  Relating  Eye contact:  Eye Contact: Normal  Facial expression:  Facial Expression: Responsive  Attitude toward examiner:  Attitude Toward Examiner: Cooperative  Thought and Language  Speech flow: Speech Flow: Normal  Thought content:  Thought Content: Appropriate to mood and circumstances  Preoccupation:     Hallucinations:     Organization:     Transport planner of Knowledge:  Fund of Knowledge: Average  Intelligence:  Intelligence: Average  Abstraction:  Abstraction: Normal  Judgement:  Judgement: Fair  Art therapist:  Reality Testing: Adequate  Insight:  Insight: Fair  Decision Making:  Decision Making: Normal  Social Functioning  Social Maturity:  Social Maturity: Responsible  Social Judgement:  Social Judgement: Normal  Stress  Stressors:  Stressors: Grief/losses, Illness  Coping Ability:  Coping Ability: English as a second language teacher Deficits:     Supports:      Family and Psychosocial History: Family history Marital status: Married Number of Years Married: 11 What types of issues is patient dealing with in the relationship?: none.  we get along good Are you sexually active?: Yes What is your sexual orientation?: heterosexual Does patient have children?: Yes How many children?: 2(Adam 4, Estill Bamberg  23) How is patient's relationship with their children?: Quita Skye will be a father in July.  He recently got married.  They both came home for Christmas.  He is in the [employer]. Daughter lives in [place].  She is a surgical tech.     Childhood History:  Childhood History By whom was/is the patient raised?: Both parents Additional childhood history information: Born in [place].  Describes childhood: only child, neighborhood full of kids, good childhood Description of patient's relationship with caregiver when they were a child: Mother: it was ok.  Father: it was ok.(Reports going to [employer].  They both worked) Patient's description of current relationship with people who raised him/her: Both Deceased.  Mother died 12-21-2011 & Father died in June 20, 1999 How were you disciplined when you got in trouble as a child/adolescent?: "I don't remember" Does patient have siblings?: No Did patient suffer any verbal/emotional/physical/sexual abuse as a child?: No Did patient suffer from severe childhood neglect?: No Has patient ever been sexually abused/assaulted/raped as an adolescent or adult?: No Was the patient ever a victim of a crime or a disaster?: No Witnessed domestic violence?: No Has patient been effected by domestic violence as an adult?: No  CCA Part Two B  Employment/Work Situation: Employment / Work Situation Employment situation: Employed 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 How long has patient been employed?: 6 months Patient's job has been impacted by current illness: No What is the longest time patient has a held a job?: 72yrs Where was the patient employed at that time?: [employer] Has patient ever been in the [employer]?: No  Education: Education Name of [employer] School: [employer] Did [employer] Graduate From [employer]?: Yes Did Physicist, medical?: No Did Heritage manager?: No Did You Have An Individualized Education Program (IIEP): No Did You Have Any Difficulty At [employer]?: No  Religion: Religion/Spirituality Are You A Religious Person?: No  Leisure/Recreation: Leisure / Recreation Leisure and Hobbies: reading, playing phase 10 on  tablet, watching Surveyor, minerals  Exercise/Diet: Exercise/Diet Do You Exercise?: No Have You Gained or Lost A Significant Amount of Weight in the Past Six Months?: No Do You Follow a Special Diet?: No Do You Have Any Trouble Sleeping?: No  CCA Part Two C  Alcohol/Drug Use: Alcohol / Drug Use Pain Medications: denies Prescriptions: Prozac, Depakote, Seroquel, Synthroid Over the Counter: multivitamin History of alcohol / drug use?: Yes Substance #1 Name of Substance 1: Alcohol 1 - Age of First Use: 19 1 - Amount (size/oz): one glass of wine or a mix drink 1 - Frequency: week 1 - Duration: 5-21yrs 1 - Last Use / Amount: Saturday/1 drink of Ginger Ale and rum                    CCA Part Three  ASAM's:  Six Dimensions of Multidimensional Assessment  Dimension 1:  Acute Intoxication and/or Withdrawal Potential:     Dimension 2:  Biomedical Conditions and Complications:     Dimension 3:  Emotional, Behavioral, or Cognitive Conditions and Complications:     Dimension 4:  Readiness to Change:     Dimension 5:  Relapse, Continued use, or Continued Problem Potential:     Dimension 6:  Recovery/Living Environment:      Substance use Disorder (SUD)    Social Function:  Social Functioning Social Maturity: Responsible Social Judgement: Normal  Stress:  Stress Stressors: Grief/losses, Illness Coping Ability: Overwhelmed Patient Takes Medications  The Way The Doctor Instructed?: Yes Priority Risk: Low Acuity  Risk Assessment- Self-Harm Potential: Risk Assessment For Self-Harm Potential Thoughts of Self-Harm: No current thoughts Method: No plan Availability of Means: No access/NA  Risk Assessment -Dangerous to Others Potential: Risk Assessment For Dangerous to Others Potential Method: No Plan Availability of Means: No access or NA Intent: Vague intent or NA Notification Required: No need or identified person  DSM5 Diagnoses: Patient Active Problem List   Diagnosis  Date Noted  . Adjustment disorder with anxiety 10/14/2016  . Anxiety 10/04/2016  . Chest pain 10/03/2016  . 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 Hypothyroidism 10/03/2016    Patient Centered Plan: Patient is on the following Treatment Plan(s):  Anxiety  Recommendations for Services/Supports/Treatments: Recommendations for Services/Supports/Treatments Recommendations For Services/Supports/Treatments: Individual Therapy, Medication Management  Treatment Plan Summary:    Referrals to Alternative Service(s): Referred to Alternative Service(s):   Place:   Date:   Time:    Referred to Alternative Service(s):   Place:   Date:   Time:    Referred to Alternative Service(s):   Place:   Date:   Time:    Referred to Alternative Service(s):   Place:   Date:   Time:     [place]

## 2017-04-14 ENCOUNTER — Ambulatory Visit (INDEPENDENT_AMBULATORY_CARE_PROVIDER_SITE_OTHER): Payer: BLUE CROSS/BLUE SHIELD | Admitting: Psychiatry

## 2017-04-14 ENCOUNTER — Ambulatory Visit: Payer: BLUE CROSS/BLUE SHIELD | Admitting: Licensed Clinical Social Worker

## 2017-04-14 ENCOUNTER — Other Ambulatory Visit: Payer: Self-pay

## 2017-04-14 ENCOUNTER — Encounter: Payer: Self-pay | Admitting: Psychiatry

## 2017-04-14 VITALS — BP 123/80 | HR 73 | Temp 98.1°F | Wt 140.6 lb

## 2017-04-14 DIAGNOSIS — F411 Generalized anxiety disorder: Secondary | ICD-10-CM

## 2017-04-14 NOTE — Progress Notes (Signed)
[facility] MD OP Progress Note  04/14/2017 2:16 PM Margaret [name]  MRN:  [id number]  Chief Complaint: ' I am ok.'  Chief Complaint    Follow-up; Medication Refill     ENI:DPOEUMP a 57 year old Caucasian female who is married, employed, lives in [place], has a history of anxiety disorder, presented to the clinic today for a follow-up visit.  Patient today reports that she is doing well on her current medication regimen.  She reports the Prozac keeps her calm.  She is happy that she was able to get off the Xanax.  She reports that Xanax made her calm for a few minutes but then her anxiety came right back while on it .  With the Prozac she feels calm throughout the day.  She denies any side effects to the current medication.  She reports she is sleeping better.  The Seroquel helps with her sleep.  She denies any side effects to the Seroquel.  She reports she continues to take Depakote as prescribed by her primary medical doctor.  She has a diagnosis of seizure disorder.  She has an upcoming MRI brain which is going to be done in April as a follow-up to rule out brain tumor.  She reports 5 doctors told her she does not have brain tumor but she sometimes become anxious when she thinks about it.  She however reports good social support and her therapist Ms. Leontine Locket has been working with her and that has been helping her too.  She currently looks forward to going to [place] for a vacation with her family and friends.  She is also looking forward to becoming a grandmother in December 2019.   Visit Diagnosis:    ICD-10-CM   1. GAD (generalized anxiety disorder) F41.1     Past Psychiatric History: Reports history of anxiety symptoms since the past several years.  Her anxiety symptoms worsened after her recent diagnosis of seizure disorder.  She denies any suicide attempts.  She denies any past inpatient mental health admissions.  Past Medical History:  Past Medical History:  Diagnosis Date  . Anxiety   .  Epilepsy ([place])   . Hypothyroidism   . Ovarian cyst     Past Surgical History:  Procedure Laterality Date  . BREAST BIOPSY Left 2012   NEG  . BREAST CYST ASPIRATION Left 2011  . OVARY SURGERY      Family Psychiatric History: Reports her father may have had anxiety disorder.  Family History:  Family History  Problem Relation Age of Onset  . Diabetes Mother   . CAD Father   . Breast cancer Neg Hx    Substance abuse history: Denies  Social History: Margaret [name] is married, employed as a Systems developer at [employer].  She lives in [place].  She has 2 adult children. Social History   Socioeconomic History  . Marital status: Married    Spouse name: hubert  . Number of children: 2  . Years of education: None  . Highest education level: High school graduate  Social Needs  . Financial resource strain: Not hard at all  . Food insecurity - worry: Never true  . Food insecurity - inability: Never true  . Transportation needs - medical: No  . Transportation needs - non-medical: No  Occupational History    Comment: part time  Tobacco Use  . Smoking status: Former Smoker    Types: Cigarettes    Last attempt to quit: 03/06/2011    Years since  quitting: 6.1  . Smokeless tobacco: Never Used  Substance and Sexual Activity  . Alcohol use: Yes    Alcohol/week: 4.8 oz    Types: 4 Glasses of wine, 4 Shots of liquor per week  . Drug use: No  . Sexual activity: Yes    Birth control/protection: None  Other Topics Concern  . None  Social History Narrative  . None    Allergies: No Known Allergies  Metabolic Disorder Labs: No results found for: HGBA1C, MPG No results found for: PROLACTIN No results found for: CHOL, TRIG, HDL, CHOLHDL, VLDL, LDLCALC Lab Results  Component Value Date   TSH 1.514 10/04/2016    Therapeutic Level Labs: No results found for: LITHIUM No results found for: VALPROATE No components found for:  CBMZ  Current Medications: Current  Outpatient Medications  Medication Sig Dispense Refill  . divalproex (DEPAKOTE) 250 MG DR tablet Take 250-500 mg by mouth See admin instructions. Take 250 mg by mouth twice daily for 3 days, then take 500 mg by mouth twice daily.  0  . FLUoxetine (PROZAC) 20 MG tablet Take 1 tablet (20 mg total) by mouth daily with breakfast. 30 tablet 2  . hydrOXYzine (VISTARIL) 25 MG capsule Take 1 capsule (25 mg total) by mouth 3 (three) times daily as needed for anxiety. 90 capsule 1  . QUEtiapine (SEROQUEL) 25 MG tablet Take 1 tablet (25 mg total) by mouth at bedtime. 30 tablet 2  . SYNTHROID 88 MCG tablet Take 88 mcg by mouth daily.  0   No current facility-administered medications for this visit.      Musculoskeletal: Strength & Muscle Tone: within normal limits Gait & Station: normal Patient leans: N/A  Psychiatric Specialty Exam: Review of Systems  Psychiatric/Behavioral: The patient is nervous/anxious.   All other systems reviewed and are negative.   Blood pressure 123/80, pulse 73, temperature 98.1 F (36.7 C), temperature source Oral, weight 140 lb 9.6 oz (63.8 kg).Body mass index is 25.72 kg/m.  General Appearance: Casual  Eye Contact:  Fair  Speech:  Normal Rate  Volume:  Normal  Mood:  Anxious  Affect:  Congruent  Thought Process:  Goal Directed and Descriptions of Associations: Intact  Orientation:  Full (Time, Place, and Person)  Thought Content: Logical   Suicidal Thoughts:  No  Homicidal Thoughts:  No  Memory:  Immediate;   Fair Recent;   Fair Remote;   Fair  Judgement:  Fair  Insight:  Fair  Psychomotor Activity:  Normal  Concentration:  Concentration: Fair and Attention Span: Fair  Recall:  [employer] of Knowledge: Fair  Language: Fair  Akathisia:  No  Handed:  Right  AIMS (if indicated): 0 , she does have very fine tremors as well as occasional jerks visible on bilateral shoulders.  She however reports she has had that since the past several years even before the  medications were started.  She also reports the fine tremors started soon after the seizures and it is not due to the Seroquel.  Assets:  Communication Skills Desire for Improvement Financial Resources/Insurance Housing Intimacy Leisure Time Resilience Social Support Talents/Skills Transportation Vocational/Educational  ADL's:  Intact  Cognition: WNL  Sleep:  Fair   Screenings:AIMS    Assessment and Plan:  Margaret [name] is a 57 year old Caucasian female who has a history of anxiety disorder, seizure disorder, hypothyroidism, presented to the clinic today for a follow-up visit.  Nathaniel reports she is currently stable on her current medication regimen.  She does have  an MRI scheduled for a follow-up to rule out brain tumor in April 2019.  She continues to remain stable with regards to her seizure disorder.  She continues to pursue psychotherapy with Ms. Peacock which has been beneficial.  Plan as noted below.  Anxiety Continue Prozac 20 mg p.o. daily. Continue hydroxyzine 25 mg p.o. twice daily as needed for anxiety attacks. Continue Seroquel 25 mg p.o. nightly Continue CBT with Ms. Peacock. Aims equals 0  Follow up in clinic in 6-8 weeks or sooner if needed.  More than 50 % of the time was spent for psychoeducation and supportive psychotherapy and care coordination.  This note was generated in part or whole with voice recognition software. Voice recognition is usually quite accurate but there are transcription errors that can and very often do occur. I apologize for any typographical errors that were not detected and corrected.        Ursula Alert, MD 04/14/2017, 2:16 PM

## 2017-05-18 ENCOUNTER — Telehealth: Payer: Self-pay

## 2017-05-18 NOTE — Telephone Encounter (Signed)
pt called asked if she can take prozac 30mg  instead of 20mg  pt states the 20mg  is not working anymore. pt states that she is working and that you can text her if you needed  too.

## 2017-05-19 ENCOUNTER — Other Ambulatory Visit: Payer: Self-pay

## 2017-05-19 ENCOUNTER — Other Ambulatory Visit: Payer: Self-pay | Admitting: Psychiatry

## 2017-05-19 ENCOUNTER — Emergency Department
Admission: EM | Admit: 2017-05-19 | Discharge: 2017-05-20 | Disposition: A | Discharge status: Home / Self Care | Payer: BLUE CROSS/BLUE SHIELD | Attending: Emergency Medicine | Admitting: Emergency Medicine

## 2017-05-19 DIAGNOSIS — G40A09 Absence epileptic syndrome, not intractable, without status epilepticus: Secondary | ICD-10-CM | POA: Diagnosis not present

## 2017-05-19 DIAGNOSIS — F411 Generalized anxiety disorder: Secondary | ICD-10-CM

## 2017-05-19 DIAGNOSIS — E039 Hypothyroidism, unspecified: Secondary | ICD-10-CM | POA: Insufficient documentation

## 2017-05-19 DIAGNOSIS — R569 Unspecified convulsions: Secondary | ICD-10-CM | POA: Diagnosis present

## 2017-05-19 DIAGNOSIS — Z79899 Other long term (current) drug therapy: Secondary | ICD-10-CM | POA: Insufficient documentation

## 2017-05-19 DIAGNOSIS — Z87891 Personal history of nicotine dependence: Secondary | ICD-10-CM | POA: Insufficient documentation

## 2017-05-19 MED ORDER — FLUOXETINE HCL 20 MG PO TABS: 30.0000 mg | ORAL_TABLET | Freq: Every day | ORAL | 2 refills | Status: DC

## 2017-05-19 NOTE — ED Provider Notes (Signed)
[facility] Emergency Department Provider Note _   First MD Initiated Contact with Patient 05/19/17 2323     (approximate)  I have reviewed the triage vital signs and the nursing notes.   HISTORY  Chief Complaint Seizures    HPI Margaret [name] is a 57 y.o. female with the below list of chronic medical conditions including Seizures Presents to the emergency department with witnessed seizure today.  Patient states that she has not had a seizure in over a year.  Patient denies any head injury.  Patient states that her Depakote dose was recently decreased secondary to side effects of Depakote.  She states since she has been in the emergency department treatment room she feels "fine".  Patient does admit to feelings of anxiety and increased stress at home which she believes is cause for her seizure activity at this time.  She states that her seizures are usually triggered by stress   Past Medical History:  Diagnosis Date  . Anxiety   . Epilepsy ([place])   . Hypothyroidism   . Ovarian cyst     Patient Active Problem List   Diagnosis Date Noted  . Adjustment disorder with anxiety 10/14/2016  . Anxiety 10/04/2016  . Chest pain 10/03/2016  . 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 Hypothyroidism 10/03/2016    Past Surgical History:  Procedure Laterality Date  . BREAST BIOPSY Left 2012   NEG  . BREAST CYST ASPIRATION Left 2011  . OVARY SURGERY      Prior to Admission medications   Medication Sig Start Date End Date Taking? Authorizing Provider  divalproex (DEPAKOTE) 250 MG DR tablet Take 250-500 mg by mouth See admin instructions. Take 250 mg by mouth twice daily for 3 days, then take 500 mg by mouth twice daily. 10/13/16   [provider]  FLUoxetine (PROZAC) 20 MG tablet Take 1.5 tablets (30 mg total) by mouth daily with breakfast. 05/19/17   Ursula Alert, MD  hydrOXYzine (VISTARIL) 25 MG capsule Take 1 capsule (25 mg total) by mouth 3  (three) times daily as needed for anxiety. 03/05/17   Ursula Alert, MD  QUEtiapine (SEROQUEL) 25 MG tablet Take 1 tablet (25 mg total) by mouth at bedtime. 03/05/17   Ursula Alert, MD  SYNTHROID 88 MCG tablet Take 88 mcg by mouth daily. 06/30/16   [provider]    Allergies Lexapro [escitalopram oxalate]  Family History  Problem Relation Age of Onset  . Diabetes Mother   . CAD Father   . Breast cancer Neg Hx     Social History Social History   Tobacco Use  . Smoking status: Former Smoker    Types: Cigarettes    Last attempt to quit: 03/06/2011    Years since quitting: 6.2  . Smokeless tobacco: Never Used  Substance Use Topics  . Alcohol use: Yes    Alcohol/week: 4.8 oz    Types: 4 Glasses of wine, 4 Shots of liquor per week  . Drug use: No    Review of Systems Constitutional: No fever/chills Eyes: No visual changes. ENT: No sore throat. Cardiovascular: Denies chest pain. Respiratory: Denies shortness of breath. Gastrointestinal: No abdominal pain.  No nausea, no vomiting.  No diarrhea.  No constipation. Genitourinary: Negative for dysuria. Musculoskeletal: Negative for neck pain.  Negative for back pain. Integumentary: Negative for rash. Neurological: Negative for headaches, focal weakness or numbness.  Positive for seizure   ____________________________________________   PHYSICAL EXAM:  VITAL SIGNS: ED  Triage Vitals  Enc Vitals Group     BP 05/19/17 2159 108/73     Pulse Rate 05/19/17 2159 83     Resp 05/19/17 2159 20     Temp 05/19/17 2159 97.7 F (36.5 C)     Temp Source 05/19/17 2159 Oral     SpO2 05/19/17 2159 95 %     Weight 05/19/17 2159 64.4 kg (142 lb)     Height 05/19/17 2159 1.575 m (5\' 2" )     Head Circumference --      Peak Flow --      Pain Score 05/19/17 2300 0     Pain Loc --      Pain Edu? --      Excl. in [place]? --     Constitutional: Alert and oriented. Well appearing and in no acute distress. Eyes: Conjunctivae are  normal.  Head: Atraumatic.Marland Kitchen Mouth/Throat: Mucous membranes are moist.  Oropharynx non-erythematous. Neck: No stridor.  Cardiovascular: Normal rate, regular rhythm. Good peripheral circulation. Grossly normal heart sounds. Respiratory: Normal respiratory effort.  No retractions. Lungs CTAB. Gastrointestinal: Soft and nontender. No distention.  Musculoskeletal: No lower extremity tenderness nor edema. No gross deformities of extremities. Neurologic:  Normal speech and language. No gross focal neurologic deficits are appreciated.  Skin:  Skin is warm, dry and intact. No rash noted. Psychiatric: Mood and affect are normal. Speech and behavior are normal.  ____________________________________________   LABS (all labs ordered are listed, but only abnormal results are displayed)  Labs Reviewed  COMPREHENSIVE METABOLIC PANEL - Abnormal; Notable for the following components:      Result Value   Glucose, Bld 116 (*)    AST 98 (*)    ALT 124 (*)    Alkaline Phosphatase 500 (*)    All other components within normal limits  CBC  VALPROIC ACID LEVEL    Procedures   ____________________________________________   INITIAL IMPRESSION / ASSESSMENT AND PLAN / ED COURSE  As part of my medical decision making, I reviewed the following data within the electronic MEDICAL RECORD NUMBER  57 year old female present with above-stated history and physical exam secondary to witnessed seizure-like activity.  Patient has a known history of Seizures.  I Reviewed the Video Recording That the Family Member Had.  What I Saw Was Consistent with an Absence Seizure.  Patient Immediately Returned to Baseline with No Postictal State.  No Further Seizure-Like Activity Noted While in the Emergency Department.  Given absence of any focal neurological deficits noted on physical exam imaging of the brain was not performed. 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    Gregor Hams, MD 05/20/17 [phone]

## 2017-05-19 NOTE — Telephone Encounter (Signed)
Pt walked in saying she wanted a dose increase on her Prozac.  She is on 20 mg and would like to go to 30 mg daily with breakfast.  We will sent the new prescription to her pharmacy at [place]. [address].

## 2017-05-19 NOTE — ED Notes (Signed)
Pt presents today after mild seizure like activity per family. Pt increased prozac and took the new dose the last 2 days. Pt is A/ox4 NAD awaiting edp

## 2017-05-19 NOTE — ED Triage Notes (Signed)
Pt here for seizures has hx of seizures but has not had seizures for over a year. Pt also doing repetitive behavior and syncopal episodes. Pt is on depakote, they decreased dosage a months ago due to side effects.

## 2017-05-19 NOTE — Telephone Encounter (Signed)
Pt was told by dr. Shea Evans that It was ok to take 1and 1/2 tablets of the prozac 20mg  for a total of 30mg . A new rx was sent in by dr. Shea Evans and pt states she going to use the 20mg  up before getting the 30mg 

## 2017-05-19 NOTE — Telephone Encounter (Signed)
pt is here in the office wanted to find out about the prozac

## 2017-05-20 LAB — COMPREHENSIVE METABOLIC PANEL
ALBUMIN: 3.6 g/dL (ref 3.5–5.0)
ALK PHOS: 500 U/L — AB (ref 38–126)
ALT: 124 U/L — AB (ref 14–54)
AST: 98 U/L — ABNORMAL HIGH (ref 15–41)
Anion gap: 15 (ref 5–15)
BILIRUBIN TOTAL: 0.5 mg/dL (ref 0.3–1.2)
BUN: 14 mg/dL (ref 6–20)
CALCIUM: 9 mg/dL (ref 8.9–10.3)
CO2: 23 mmol/L (ref 22–32)
CREATININE: 0.72 mg/dL (ref 0.44–1.00)
Chloride: 103 mmol/L (ref 101–111)
GFR calc Af Amer: >60 mL/min (ref >60)
GFR calc non Af Amer: >60 mL/min (ref >60)
GLUCOSE: 116 mg/dL — AB (ref 65–99)
Potassium: 3.7 mmol/L (ref 3.5–5.1)
SODIUM: 141 mmol/L (ref 135–145)
Total Protein: 7.7 g/dL (ref 6.5–8.1)

## 2017-05-20 LAB — CBC
HEMATOCRIT: 37.1 % (ref 35.0–47.0)
HEMOGLOBIN: 12.9 g/dL (ref 12.0–16.0)
MCH: 32.4 pg (ref 26.0–34.0)
MCHC: 34.9 g/dL (ref 32.0–36.0)
MCV: 93 fL (ref 80.0–100.0)
Platelets: 230 10*3/uL (ref 150–440)
RBC: 3.99 MIL/uL (ref 3.80–5.20)
RDW: 14.1 % (ref 11.5–14.5)
WBC: 7 10*3/uL (ref 3.6–11.0)

## 2017-05-20 LAB — VALPROIC ACID LEVEL: Valproic Acid Lvl: 49 ug/mL — ABNORMAL LOW (ref 50.0–100.0)

## 2017-05-27 ENCOUNTER — Telehealth: Payer: Self-pay

## 2017-05-27 ENCOUNTER — Other Ambulatory Visit: Payer: Self-pay | Admitting: Internal Medicine

## 2017-05-27 DIAGNOSIS — R748 Abnormal levels of other serum enzymes: Secondary | ICD-10-CM

## 2017-05-27 NOTE — Telephone Encounter (Signed)
Spoke to patient, she is anxious about her USG tomorrow . She wonders if she can take one xanax prior to the test. Discussed with her she could take it if she is very anxious.

## 2017-05-27 NOTE — Progress Notes (Signed)
  THERAPIST PROGRESS NOTE   Date of Service:   04/14/2017  Session Time:   1hour  Patient:   Margaret [name]   DOB:   [birth year]  MR Number:  [id number]  Location:  [facility] [place] [address] Dept: [phone]            Provider/Observer:  Lubertha South Counselor  Risk of Suicide/Violence: virtually non-existent   Diagnosis:    GAD (generalized anxiety disorder)  Type of Therapy: Individual Therapy  Treatment Goals addressed: Anxiety  Participation Level: Active    Behavioral Observation: MIKALA PODOLL  presents as a 57 y.o.-year-old that is motivated for treatment.    Interventions: CBT and Motivational Interviewing   Behavioral Response: NeatAlertAnxious   Summary: Therapist met with Patient in an initial therapy session to assess current mood and to build rapport. Therapist engaged Patient in discussion about her life and what is going well for her. Therapist provided support for Patient as she shared details about her life, her current stressors, mood, coping skills, her past, and her children. Therapist prompted Patient to discuss her support system and ways that she manages her daily stress, anger, and frustrations. Reviewed with Patient the changes that she has noticed since medication change.  Discussion of coping skills for her to use such as: grounding and breathing techniques.   Plan: ESTEPHANI POPPER will use coping skills to reduce symptoms   Return again in 2 weeks.

## 2017-05-27 NOTE — Telephone Encounter (Signed)
pt states that she needs a ultra sounds tomorrown and she was wondering if she can take a xanax?    Pt states she is having a lot of anxiety and worrying that she has cancer.

## 2017-05-28 ENCOUNTER — Ambulatory Visit
Admission: RE | Admit: 2017-05-28 | Discharge: 2017-05-28 | Disposition: A | Discharge status: Home / Self Care | Payer: BLUE CROSS/BLUE SHIELD | Source: Ambulatory Visit | Attending: Internal Medicine | Admitting: Internal Medicine

## 2017-05-28 DIAGNOSIS — R748 Abnormal levels of other serum enzymes: Secondary | ICD-10-CM | POA: Diagnosis not present

## 2017-05-28 DIAGNOSIS — K8020 Calculus of gallbladder without cholecystitis without obstruction: Secondary | ICD-10-CM | POA: Diagnosis not present

## 2017-06-12 ENCOUNTER — Ambulatory Visit: Payer: BLUE CROSS/BLUE SHIELD | Admitting: Psychiatry

## 2017-06-30 ENCOUNTER — Ambulatory Visit: Payer: Self-pay | Admitting: Psychiatry

## 2017-06-30 ENCOUNTER — Encounter: Payer: Self-pay | Admitting: Psychiatry

## 2017-06-30 ENCOUNTER — Other Ambulatory Visit: Payer: Self-pay

## 2017-06-30 VITALS — BP 128/78 | HR 80 | Temp 98.0°F | Wt 145.0 lb

## 2017-06-30 DIAGNOSIS — F411 Generalized anxiety disorder: Secondary | ICD-10-CM

## 2017-06-30 MED ORDER — FLUOXETINE HCL 40 MG PO CAPS: 40.0000 mg | ORAL_CAPSULE | Freq: Every day | ORAL | 1 refills | Status: DC

## 2017-06-30 NOTE — Progress Notes (Signed)
[place] MD OP Progress Note  06/30/2017 1:59 PM Margaret DILLOW  MRN:  [id number]  Chief Complaint: ' I am here for follow up." Chief Complaint    Follow-up; Medication Refill     HPI: Margaret [name] is a 56 year old Caucasian female who is married, employed, lives in [place], has a history of anxiety disorder, presented to the clinic today for a follow-up visit.  Margaret [name] today reports she continues to have some anxiety symptoms when it comes to her health issues.  She reports she had her abdominal ultrasound as well as the lab testing completed.  She reports everything came back okay.  She however reports she is scheduled to see her neurologist this afternoon and he is going to schedule the MRI to rule out brain tumor.  She reports she continues to be anxious when she thinks about the MRI.  She wonders whether there is any medication she can take when she feels  that anxious.  Discussed with her about increasing her Prozac.  She is currently on 30 mg, discussed to go up to 40 mg.  She agrees with plan.  Also discussed taking hydroxyzine as needed.  Patient reports she did not even try the hydroxyzine yet and would definitely try it when she feels the need.  Also discussed continuing psychotherapy.  Patient sees Ms. Peacock.  Patient continues to take Seroquel at bedtime for sleep as well as anxiety symptoms.  She reports she does not take it every night.  She reports she wants to wean herself off of some of the medications if possible.  Patient continues to have good support from her family.  Patient continues to deny abusing drugs or alcohol.  Patient does have a history of seizure disorder.  Her seizures were recently diagnosed.  She also has elevated LFTs which does not need any follow-up treatment at this time per her .  She however is on Depakote for her seizures.  She will discuss the same with her neurologist whether she can be tried on another medication which is more safe on her hepatic  functions. Visit Diagnosis:    ICD-10-CM   1. GAD (generalized anxiety disorder) F41.1     Past Psychiatric History: Reports history of anxiety symptoms since the past several years.  Her anxiety symptoms worsened after her recent diagnosis of seizure disorder.  She denies any suicide attempts.  She denies any past inpatient mental health admissions.  Past Medical History:  Past Medical History:  Diagnosis Date  . Anxiety   . Epilepsy ([place])   . Hypothyroidism   . Ovarian cyst     Past Surgical History:  Procedure Laterality Date  . BREAST BIOPSY Left 2012   NEG  . BREAST CYST ASPIRATION Left 2011  . OVARY SURGERY      Family Psychiatric History: Reports her father may have had anxiety disorder.  Family History:  Family History  Problem Relation Age of Onset  . Diabetes Mother   . CAD Father   . Breast cancer Neg Hx    Substance abuse history: Denies  Social History: Margaret [name] is married, employed as a Systems developer at [employer].  She lives in [place].  She has 2 adult children Social History   Socioeconomic History  . Marital status: Married    Spouse name: hubert  . Number of children: 2  . Years of education: Not on file  . Highest education level: High school graduate  Occupational History    Comment: part  time  Social Needs  . Financial resource strain: Not hard at all  . Food insecurity:    Worry: Never true    Inability: Never true  . Transportation needs:    Medical: No    Non-medical: No  Tobacco Use  . Smoking status: Former Smoker    Types: Cigarettes    Last attempt to quit: 03/06/2011    Years since quitting: 6.3  . Smokeless tobacco: Never Used  Substance and Sexual Activity  . Alcohol use: Yes    Alcohol/week: 4.8 oz    Types: 4 Glasses of wine, 4 Shots of liquor per week  . Drug use: No  . Sexual activity: Yes    Birth control/protection: None  Lifestyle  . Physical activity:    Days per week: 7 days     Minutes per session: 30 min  . Stress: Not at all  Relationships  . Social connections:    Talks on phone: Never    Gets together: Never    Attends religious service: Never    Active member of club or organization: No    Attends meetings of clubs or organizations: Never    Relationship status: Married  Other Topics Concern  . Not on file  Social History Narrative  . Not on file    Allergies:  Allergies  Allergen Reactions  . Lexapro [Escitalopram Oxalate] Nausea Only    Metabolic Disorder Labs: No results found for: HGBA1C, MPG No results found for: PROLACTIN No results found for: CHOL, TRIG, HDL, CHOLHDL, VLDL, LDLCALC Lab Results  Component Value Date   TSH 1.514 10/04/2016    Therapeutic Level Labs: No results found for: LITHIUM Lab Results  Component Value Date   VALPROATE 49 (L) 05/20/2017   No components found for:  CBMZ  Current Medications: Current Outpatient Medications  Medication Sig Dispense Refill  . divalproex (DEPAKOTE) 250 MG DR tablet Take 250-500 mg by mouth See admin instructions. Take 250 mg by mouth twice daily for 3 days, then take 500 mg by mouth twice daily.  0  . hydrOXYzine (VISTARIL) 25 MG capsule Take 1 capsule (25 mg total) by mouth 3 (three) times daily as needed for anxiety. 90 capsule 1  . QUEtiapine (SEROQUEL) 25 MG tablet Take 1 tablet (25 mg total) by mouth at bedtime. 30 tablet 2  . SYNTHROID 88 MCG tablet Take 88 mcg by mouth daily.  0  . FLUoxetine (PROZAC) 40 MG capsule Take 1 capsule (40 mg total) by mouth daily. 90 capsule 1   No current facility-administered medications for this visit.      Musculoskeletal: Strength & Muscle Tone: within normal limits Gait & Station: normal Patient leans: N/A  Psychiatric Specialty Exam: Review of Systems  Psychiatric/Behavioral: The patient is nervous/anxious.   All other systems reviewed and are negative.   Blood pressure 128/78, pulse 80, temperature 98 F (36.7 C),  temperature source Oral, weight 145 lb (65.8 kg).Body mass index is 26.52 kg/m.  General Appearance: Casual  Eye Contact:  Fair  Speech:  Normal Rate  Volume:  Normal  Mood:  Anxious  Affect:  Congruent  Thought Process:  Goal Directed and Descriptions of Associations: Intact  Orientation:  Full (Time, Place, and Person)  Thought Content: Logical   Suicidal Thoughts:  No  Homicidal Thoughts:  No  Memory:  Immediate;   Fair Recent;   Fair Remote;   Fair  Judgement:  Fair  Insight:  Fair  Psychomotor Activity:  Normal  Concentration:  Concentration: Fair and Attention Span: Fair  Recall:  [employer] of Knowledge: Fair  Language: Fair  Akathisia:  No  Handed:  Right  AIMS (if indicated): 0  Assets:  Communication Skills Desire for Improvement Housing Social Support Talents/Skills Transportation  ADL's:  Intact  Cognition: WNL  Sleep:  Fair   Screenings:AIMS   Assessment and Plan: Margaret [name] is a 57 year old Caucasian female who has a history of anxiety disorder, seizure disorder, hypothyroidism, presented to the clinic today for a follow-up visit.  Margaret [name] reports she is currently stable on her current medication regimen.  She does have an MRI scheduled for follow-up to rule out brain tumor which is kind of making her anxious.  She continues to be in psychotherapy with Ms. Peacock.  Discussed medication changes with patient.  Plan as noted below.  Plan Anxiety symptoms Increase Prozac to 40 mg p.o. daily Continue hydroxyzine 25 mg p.o. twice daily as needed for anxiety attacks Continue Seroquel 25 mg p.o. nightly Continue CBT with Ms. Peacock Aims equals 0   Follow-up in clinic in 6-8 weeks or sooner if needed.  More than 50 % of the time was spent for psychoeducation and supportive psychotherapy and care coordination.  This note was generated in part or whole with voice recognition software. Voice recognition is usually quite accurate but there are transcription  errors that can and very often do occur. I apologize for any typographical errors that were not detected and corrected.       Ursula Alert, MD 07/01/2017, 8:42 AM

## 2017-07-01 ENCOUNTER — Encounter: Payer: Self-pay | Admitting: Psychiatry

## 2017-08-18 ENCOUNTER — Ambulatory Visit: Payer: BLUE CROSS/BLUE SHIELD | Admitting: Psychiatry

## 2017-09-04 ENCOUNTER — Other Ambulatory Visit: Payer: Self-pay | Admitting: Psychiatry

## 2017-09-04 DIAGNOSIS — F411 Generalized anxiety disorder: Secondary | ICD-10-CM

## 2017-09-04 NOTE — Telephone Encounter (Signed)
Sent seroquel to pharmacy 

## 2017-09-08 DIAGNOSIS — R569 Unspecified convulsions: Secondary | ICD-10-CM | POA: Insufficient documentation

## 2017-09-24 ENCOUNTER — Other Ambulatory Visit: Payer: Self-pay | Admitting: Obstetrics and Gynecology

## 2017-09-24 DIAGNOSIS — Z1231 Encounter for screening mammogram for malignant neoplasm of breast: Secondary | ICD-10-CM

## 2017-11-16 ENCOUNTER — Ambulatory Visit
Admission: RE | Admit: 2017-11-16 | Discharge: 2017-11-16 | Disposition: A | Discharge status: Home / Self Care | Payer: BLUE CROSS/BLUE SHIELD | Source: Ambulatory Visit | Attending: Obstetrics and Gynecology | Admitting: Obstetrics and Gynecology

## 2017-11-16 DIAGNOSIS — Z1231 Encounter for screening mammogram for malignant neoplasm of breast: Secondary | ICD-10-CM

## 2017-11-17 ENCOUNTER — Other Ambulatory Visit: Payer: Self-pay | Admitting: Obstetrics and Gynecology

## 2017-11-17 DIAGNOSIS — R928 Other abnormal and inconclusive findings on diagnostic imaging of breast: Secondary | ICD-10-CM

## 2017-11-18 ENCOUNTER — Other Ambulatory Visit: Payer: Self-pay

## 2017-11-18 ENCOUNTER — Emergency Department
Admission: EM | Admit: 2017-11-18 | Discharge: 2017-11-18 | Disposition: A | Discharge status: Home / Self Care | Payer: BLUE CROSS/BLUE SHIELD | Attending: Emergency Medicine | Admitting: Emergency Medicine

## 2017-11-18 ENCOUNTER — Encounter: Payer: Self-pay | Admitting: Emergency Medicine

## 2017-11-18 DIAGNOSIS — Z79899 Other long term (current) drug therapy: Secondary | ICD-10-CM | POA: Diagnosis not present

## 2017-11-18 DIAGNOSIS — Z87891 Personal history of nicotine dependence: Secondary | ICD-10-CM | POA: Insufficient documentation

## 2017-11-18 DIAGNOSIS — R569 Unspecified convulsions: Secondary | ICD-10-CM | POA: Diagnosis not present

## 2017-11-18 DIAGNOSIS — E039 Hypothyroidism, unspecified: Secondary | ICD-10-CM | POA: Insufficient documentation

## 2017-11-18 LAB — VALPROIC ACID LEVEL: Valproic Acid Lvl: 54 ug/mL (ref 50.0–100.0)

## 2017-11-18 MED ORDER — LORAZEPAM 1 MG PO TABS: 0.5000 mg | ORAL_TABLET | Freq: Two times a day (BID) | ORAL | 0 refills | Status: AC | PRN

## 2017-11-18 MED ORDER — LORAZEPAM 0.5 MG PO TABS
0.5000 mg | ORAL_TABLET | Freq: Once | ORAL | Status: AC
  Administered 2017-11-18: 0.5 mg via ORAL
  Filled 2017-11-18: qty 1

## 2017-11-18 NOTE — ED Provider Notes (Signed)
[facility] Emergency Department Provider Note   ____________________________________________   First MD Initiated Contact with Patient 11/18/17 1734     (approximate)  I have reviewed the triage vital signs and the nursing notes.   HISTORY  Chief Complaint Seizures    HPI Margaret [name] is a 57 y.o. female who says she gets a lot of anxiety.  When she gets anxiety she has seizures.  She was very anxious because she was called back to have further evaluation of her mammogram.  She says she is never had this happen before and she is very worried.  She wanted them to do it today but they would not.  I discussed with her the fact that they are just being careful and after reading the mammogram report myself told her again that they are being careful and that there is not any definitive evidence of cancer or other tumor there.  This seems to reassure her.   Past Medical History:  Diagnosis Date  . Anxiety   . Epilepsy ([place])   . Hypothyroidism   . Ovarian cyst     Patient Active Problem List   Diagnosis Date Noted  . Adjustment disorder with anxiety 10/14/2016  . Anxiety 10/04/2016  . Chest pain 10/03/2016  . 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 Hypothyroidism 10/03/2016    Past Surgical History:  Procedure Laterality Date  . BREAST BIOPSY Left 2012   NEG  . BREAST CYST ASPIRATION Left 2011  . OVARY SURGERY      Prior to Admission medications   Medication Sig Start Date End Date Taking? Authorizing Provider  divalproex (DEPAKOTE) 250 MG DR tablet Take 250-500 mg by mouth See admin instructions. Take 250 mg by mouth twice daily for 3 days, then take 500 mg by mouth twice daily. 10/13/16   [provider]  FLUoxetine (PROZAC) 40 MG capsule Take 1 capsule (40 mg total) by mouth daily. 06/30/17   Ursula Alert, MD  hydrOXYzine (VISTARIL) 25 MG capsule Take 1 capsule (25 mg total) by mouth 3 (three) times daily as needed for anxiety.  03/05/17   Ursula Alert, MD  LORazepam (ATIVAN) 1 MG tablet Take 0.5 tablets (0.5 mg total) by mouth 2 (two) times daily as needed for anxiety. As needed for severe anxiety 11/18/17 11/18/18  Nena Polio, MD  QUEtiapine (SEROQUEL) 25 MG tablet TAKE 1 TABLET BY MOUTH AT BEDTIME 09/04/17   Eappen, Ria Clock, MD  SYNTHROID 88 MCG tablet Take 88 mcg by mouth daily. 06/30/16   [provider]    Allergies Keppra [levetiracetam] and Lexapro [escitalopram oxalate]  Family History  Problem Relation Age of Onset  . Diabetes Mother   . CAD Father   . Breast cancer Neg Hx     Social History Social History   Tobacco Use  . Smoking status: Former Smoker    Types: Cigarettes    Last attempt to quit: 03/06/2011    Years since quitting: 6.7  . Smokeless tobacco: Never Used  Substance Use Topics  . Alcohol use: Yes    Alcohol/week: 8.0 standard drinks    Types: 4 Glasses of wine, 4 Shots of liquor per week  . Drug use: No    Review of Systems  Constitutional: No fever/chills Eyes: No visual changes. ENT: No sore throat. Cardiovascular: Denies chest pain. Respiratory: Denies shortness of breath. Gastrointestinal: No abdominal pain.  No nausea, no vomiting.  No diarrhea.  No constipation. Genitourinary: Negative for dysuria.  Musculoskeletal: Negative for back pain. Skin: Negative for rash. Neurological: Negative for headaches, focal weakness or  ____________________________________________   PHYSICAL EXAM:  VITAL SIGNS: ED Triage Vitals [11/18/17 1556]  Enc Vitals Group     BP 116/81     Pulse Rate 73     Resp 20     Temp 97.9 F (36.6 C)     Temp Source Oral     SpO2 100 %     Weight 145 lb (65.8 kg)     Height 5\' 1"  (1.549 m)     Head Circumference      Peak Flow      Pain Score 0     Pain Loc      Pain Edu?      Excl. in [place]?     Constitutional: Alert and oriented. Well appearing and in no acute distress. Eyes: Conjunctivae are normal.  Pupils are equal  round extraocular movements are intact Head: Atraumatic. Nose: No congestion/rhinnorhea. Mouth/Throat: Mucous membranes are moist.  Oropharynx non-erythematous. Neck: No stridor.  phadenopathy. Cardiovascular: Normal rate, regular rhythm. Grossly normal heart sounds.  Good peripheral circulation. Respiratory: Normal respiratory effort.  No retractions. Lungs CTAB. Gastrointestinal: Soft and nontender. No distention. No abdominal bruits. No CVA tenderness. Musculoskeletal: No lower extremity tenderness nor edema.  Neurologic:  Normal speech and language. No gross focal neurologic deficits are appreciated.  Cranial nerves II through XII are intact cerebellar finger-nose rapid alternating movements and hands are normal motor strength is 5/5 throughout patient does not report any numbness. Skin:  Skin is warm, dry and intact. No rash noted. Psychiatric: Mood and affect are normal. Speech and behavior are normal.  ____________________________________________   LABS (all labs ordered are listed, but only abnormal results are displayed)  Labs Reviewed  VALPROIC ACID LEVEL   ____________________________________________  EKG   ____________________________________________  RADIOLOGY  ED MD interpretation:    Official radiology report(s): No results found.  ____________________________________________   PROCEDURES  Procedure(s) performed:   Procedures  Critical Care performed:   ____________________________________________   INITIAL IMPRESSION / ASSESSMENT AND PLAN / ED COURSE  Patient's Depakote level was normal.  Patient had spoken with her neurologist today.  She reports she only gets seizures when she is very anxious.  I have given her a very small amount of Ativan to use if she gets very anxious.  I will have her follow-up with her doctor and discuss with her not more of this would be useful.         ____________________________________________   FINAL  CLINICAL IMPRESSION(S) / ED DIAGNOSES  Final diagnoses:  Seizure [facility])     ED Discharge Orders         Ordered    LORazepam (ATIVAN) 1 MG tablet  2 times daily PRN     11/18/17 1853           Note:  This document was prepared using Dragon voice recognition software and may include unintentional dictation errors.    Nena Polio, MD 11/18/17 [phone]

## 2017-11-18 NOTE — ED Triage Notes (Signed)
Pt reports that she went to have a mammogram on Monday and they called to have her to come back and have a diagnostic mammogram this week. She reports that she has seizures if her nerves get in a "tizzy". She called them and beg them to do it today and would not so it caused her to have a seizure. She reports that she did take prozac before coming.

## 2017-11-18 NOTE — Discharge Instructions (Addendum)
Please return as needed.  Please follow-up with your treating physicians and your psychiatrist.  For now use the Ativan 1 twice a day as needed for severe anxiety.

## 2017-11-19 ENCOUNTER — Ambulatory Visit
Admission: RE | Admit: 2017-11-19 | Discharge: 2017-11-19 | Disposition: A | Discharge status: Home / Self Care | Payer: BLUE CROSS/BLUE SHIELD | Source: Ambulatory Visit | Attending: Obstetrics and Gynecology | Admitting: Obstetrics and Gynecology

## 2017-11-19 DIAGNOSIS — R928 Other abnormal and inconclusive findings on diagnostic imaging of breast: Secondary | ICD-10-CM

## 2017-12-01 ENCOUNTER — Other Ambulatory Visit: Payer: Self-pay | Admitting: Psychiatry

## 2017-12-01 DIAGNOSIS — F411 Generalized anxiety disorder: Secondary | ICD-10-CM

## 2017-12-21 ENCOUNTER — Other Ambulatory Visit: Payer: Self-pay | Admitting: Psychiatry

## 2017-12-30 ENCOUNTER — Other Ambulatory Visit: Payer: Self-pay | Admitting: Psychiatry

## 2017-12-30 ENCOUNTER — Other Ambulatory Visit (HOSPITAL_COMMUNITY): Payer: Self-pay | Admitting: Neurology

## 2017-12-30 ENCOUNTER — Other Ambulatory Visit: Payer: Self-pay | Admitting: Neurology

## 2017-12-30 DIAGNOSIS — R404 Transient alteration of awareness: Secondary | ICD-10-CM

## 2017-12-30 DIAGNOSIS — F411 Generalized anxiety disorder: Secondary | ICD-10-CM

## 2018-01-11 ENCOUNTER — Other Ambulatory Visit: Payer: Self-pay | Admitting: Psychiatry

## 2018-01-11 DIAGNOSIS — F411 Generalized anxiety disorder: Secondary | ICD-10-CM

## 2018-01-14 ENCOUNTER — Encounter: Payer: Self-pay | Admitting: Psychiatry

## 2018-01-14 ENCOUNTER — Ambulatory Visit: Payer: BLUE CROSS/BLUE SHIELD | Admitting: Psychiatry

## 2018-01-14 ENCOUNTER — Other Ambulatory Visit: Payer: Self-pay

## 2018-01-14 VITALS — BP 121/79 | HR 79 | Temp 97.3°F | Wt 150.2 lb

## 2018-01-14 DIAGNOSIS — F411 Generalized anxiety disorder: Secondary | ICD-10-CM | POA: Diagnosis not present

## 2018-01-14 MED ORDER — QUETIAPINE FUMARATE 25 MG PO TABS: 25.0000 mg | ORAL_TABLET | Freq: Every day | ORAL | 1 refills | Status: DC

## 2018-01-14 MED ORDER — FLUOXETINE HCL 40 MG PO CAPS: 40.0000 mg | ORAL_CAPSULE | Freq: Every day | ORAL | 1 refills | Status: DC

## 2018-01-14 NOTE — Progress Notes (Signed)
[place] MD OP Progress Note  01/14/2018 4:08 PM Margaret Margaret [name]  MRN:  [id number]  Chief Complaint: ' I am here Margaret [name] follow up.' Chief Complaint    Follow-up; Medication Refill     HPI: Margaret [name] is a 57 year old Caucasian female who is Margaret [name], Margaret Margaret [name], Margaret Margaret [name], Margaret Margaret [name], Margaret Margaret [name] a follow-up visit.  Patient today reports she continues to have some anxiety symptoms on and off.  She reports it is mostly triggered by her health issues.  She reports she had a mammogram done recently and they were not very clear with her about the results.  She reports she went into a panic mode and had to go to the emergency department.  She however reports the mammogram however came back as normal and she was relieved.  Patient reports she however Margaret been able to cope with her anxiety symptoms better most of the time.  She reports she is compliant with her Prozac.  She believes the Prozac helps.  She is also on Seroquel which helps with her sleep at night.  She reports she does not have any side effects to her medications at this time.  She denies any abnormal involuntary movements from the Seroquel.  She reports she wants to stay on the Seroquel at this time.  Patient denies any suicidality.  She denies any perceptual disturbances.  Patient denies any other concerns today. Visit Diagnosis:    ICD-10-CM   1. GAD (generalized anxiety Margaret [name]) F41.1 QUEtiapine (SEROQUEL) 25 MG tablet    Past Psychiatric History: Have reviewed past psychiatric history from my progress note on 06/30/2017.  Past Medical History:  Past Medical History:  Diagnosis Date  . Anxiety   . Epilepsy ([place])   . Hypothyroidism   . Ovarian cyst     Past Surgical History:  Procedure Laterality Date  . BREAST BIOPSY Left 2012   NEG  . BREAST CYST ASPIRATION Left 2011  . OVARY SURGERY      Family Psychiatric History: Have reviewed family psychiatric history from my progress note  on 06/30/2017  Family History:  Family History  Problem Relation Age of Onset  . Diabetes Mother   . CAD Father   . Breast cancer Neg Hx     Social History: I have reviewed social history from my progress note on 06/30/2017. Social History   Socioeconomic History  . Marital status: Margaret [name]    Spouse name: hubert  . Number of children: 2  . Years of education: Not on file  . Highest education level: High school graduate  Occupational History    Comment: part time  Social Needs  . Financial resource strain: Not hard at all  . Food insecurity:    Worry: Never true    Inability: Never true  . Transportation needs:    Medical: No    Non-medical: No  Tobacco Use  . Smoking status: Former Smoker    Types: Cigarettes    Last attempt to quit: 03/06/2011    Years since quitting: 6.8  . Smokeless tobacco: Never Used  Substance and Sexual Activity  . Alcohol use: Yes    Alcohol/week: 8.0 standard drinks    Types: 4 Glasses of wine, 4 Shots of liquor per week  . Drug use: No  . Sexual activity: Yes    Birth control/protection: None  Lifestyle  . Physical activity:    Days per week: 7 days    Minutes per session:  30 min  . Stress: Not at all  Relationships  . Social connections:    Talks on phone: Never    Gets together: Never    Attends religious service: Never    Active member of club or organization: No    Attends meetings of clubs or organizations: Never    Relationship status: Margaret [name]  Other Topics Concern  . Not on file  Social History Narrative  . Not on file    Allergies:  Allergies  Allergen Reactions  . Keppra [Levetiracetam]   . Lexapro [Escitalopram Oxalate] Nausea Only    Metabolic Margaret [name] Labs: No results found Margaret [name]: HGBA1C, MPG No results found Margaret [name]: PROLACTIN No results found Margaret [name]: CHOL, TRIG, HDL, CHOLHDL, VLDL, LDLCALC Lab Results  Component Value Date   TSH 1.514 10/04/2016    Therapeutic Level Labs: No results found Margaret [name]: LITHIUM Lab  Results  Component Value Date   VALPROATE 54 11/18/2017   VALPROATE 49 (L) 05/20/2017   No components found Margaret [name]:  CBMZ  Current Medications: Current Outpatient Medications  Medication Sig Dispense Refill  . divalproex (DEPAKOTE) 250 MG DR tablet Take 250-500 mg by mouth See admin instructions. Take 250 mg by mouth twice daily Margaret [name] 3 days, then take 500 mg by mouth twice daily.  0  . divalproex (DEPAKOTE) 250 MG DR tablet Take by mouth.    Marland Kitchen FLUoxetine (PROZAC) 40 MG capsule Take 1 capsule (40 mg total) by mouth daily. 90 capsule 1  . hydrOXYzine (VISTARIL) 25 MG capsule Take 1 capsule (25 mg total) by mouth 3 (three) times daily as needed Margaret [name] anxiety. 90 capsule 1  . LORazepam (ATIVAN) 1 MG tablet Take 0.5 tablets (0.5 mg total) by mouth 2 (two) times daily as needed Margaret [name] anxiety. As needed Margaret [name] severe anxiety 4 tablet 0  . QUEtiapine (SEROQUEL) 25 MG tablet Take 1 tablet (25 mg total) by mouth at bedtime. 90 tablet 1  . SYNTHROID 88 MCG tablet Take 88 mcg by mouth daily.  0  . levothyroxine (SYNTHROID, LEVOTHROID) 75 MCG tablet   5  . loratadine (CLARITIN) 10 MG tablet Take by mouth.    . Multiple Vitamin (MULTI-VITAMINS) TABS Take by mouth.     No current facility-administered medications Margaret [name] this visit.      Musculoskeletal: Strength & Muscle Tone: within normal limits Gait & Station: normal Patient leans: N/A  Psychiatric Specialty Exam: Review of Systems  Psychiatric/Behavioral: The patient is nervous/anxious (situational).   All other systems reviewed and are negative.   Blood pressure 121/79, pulse 79, temperature (!) 97.3 F (36.3 C), temperature source Oral, weight 150 lb 3.2 oz (68.1 kg).Body mass index is 28.38 kg/m.  General Appearance: Casual  Eye Contact:  Fair  Speech:  Clear and Coherent  Volume:  Normal  Mood:  Anxious  Affect:  Congruent  Thought Process:  Goal Directed and Descriptions of Associations: Intact  Orientation:  Full (Time, Place, and Person)   Thought Content: Logical   Suicidal Thoughts:  No  Homicidal Thoughts:  No  Memory:  Immediate;   Fair Recent;   Fair Remote;   Fair  Judgement:  Fair  Insight:  Fair  Psychomotor Activity:  Normal  Concentration:  Concentration: Fair and Attention Span: Fair  Recall:  [employer] of Knowledge: Fair  Language: Fair  Akathisia:  No  Handed:  Right  AIMS (if indicated): 0  Assets:  Communication Skills Desire Margaret [name] Improvement Social Support  ADL's:  Intact  Cognition: WNL  Sleep:  Fair   Screenings:   Assessment and Plan: Flecia is a 57 year old Caucasian female who Margaret Margaret [name], seizure Margaret [name], hypothyroidism, Margaret Margaret [name] a follow-up visit.  Patient continues to have anxiety symptoms mostly triggered by her health problems.  She however reports she is coping better.  We will continue medications as noted below.  Plan Anxiety symptoms Prozac 40 mg p.o. daily Hydroxyzine 25 mg p.o. twice daily as needed Margaret [name] anxiety attacks Seroquel 25 mg p.o. nightly.  Patient reports she would like to try to cut the Seroquel to half some nights to see if that will help her to gradually be need to off. AIMS - 0  Follow-up in clinic in 3 months or sooner if needed.  More than 50 % of the time was spent Margaret [name] psychoeducation and supportive psychotherapy and care coordination.  This note was generated in part or whole with voice recognition software. Voice recognition is usually quite accurate but there are transcription errors that can and very often do occur. I apologize Margaret [name] any typographical errors that were not detected and corrected.       Ursula Alert, MD 01/14/2018, 4:08 PM

## 2018-01-21 ENCOUNTER — Ambulatory Visit
Admission: RE | Admit: 2018-01-21 | Discharge: 2018-01-21 | Disposition: A | Discharge status: Home / Self Care | Payer: BLUE CROSS/BLUE SHIELD | Source: Ambulatory Visit | Attending: Neurology | Admitting: Neurology

## 2018-01-21 DIAGNOSIS — R404 Transient alteration of awareness: Secondary | ICD-10-CM

## 2018-01-21 MED ORDER — GADOBUTROL 1 MMOL/ML IV SOLN
6.5000 mL | Freq: Once | INTRAVENOUS | Status: AC | PRN
  Administered 2018-01-21: 6.5 mL via INTRAVENOUS

## 2018-01-26 DIAGNOSIS — R748 Abnormal levels of other serum enzymes: Secondary | ICD-10-CM | POA: Insufficient documentation

## 2018-05-13 ENCOUNTER — Encounter: Payer: Self-pay | Admitting: Psychiatry

## 2018-05-13 ENCOUNTER — Ambulatory Visit: Payer: BLUE CROSS/BLUE SHIELD | Admitting: Psychiatry

## 2018-05-13 ENCOUNTER — Other Ambulatory Visit: Payer: Self-pay

## 2018-05-13 VITALS — BP 125/78 | HR 76 | Temp 97.8°F | Wt 154.2 lb

## 2018-05-13 DIAGNOSIS — G4701 Insomnia due to medical condition: Secondary | ICD-10-CM

## 2018-05-13 DIAGNOSIS — F411 Generalized anxiety disorder: Secondary | ICD-10-CM

## 2018-05-13 MED ORDER — FLUOXETINE HCL 40 MG PO CAPS: 40.0000 mg | ORAL_CAPSULE | Freq: Every day | ORAL | 1 refills | Status: DC

## 2018-05-13 MED ORDER — TRAZODONE HCL 50 MG PO TABS: 25.0000 mg | ORAL_TABLET | Freq: Every evening | ORAL | 0 refills | Status: DC | PRN

## 2018-05-13 MED ORDER — DIVALPROEX SODIUM 250 MG PO DR TAB: 500.0000 mg | DELAYED_RELEASE_TABLET | Freq: Every day | ORAL | 0 refills | Status: DC

## 2018-05-13 NOTE — Patient Instructions (Signed)
Trazodone tablets What is this medicine? TRAZODONE (TRAZ oh done) is used to treat depression. This medicine may be used for other purposes; ask your health care provider or pharmacist if you have questions. COMMON BRAND NAME(S): Desyrel What should I tell my health care provider before I take this medicine? They need to know if you have any of these conditions: -attempted suicide or thinking about it -bipolar disorder -bleeding problems -glaucoma -heart disease, or previous heart attack -irregular heart beat -kidney or liver disease -low levels of sodium in the blood -an unusual or allergic reaction to trazodone, other medicines, foods, dyes or preservatives -pregnant or trying to get pregnant -breast-feeding How should I use this medicine? Take this medicine by mouth with a glass of water. Follow the directions on the prescription label. Take this medicine shortly after a meal or a light snack. Take your medicine at regular intervals. Do not take your medicine more often than directed. Do not stop taking this medicine suddenly except upon the advice of your doctor. Stopping this medicine too quickly may cause serious side effects or your condition may worsen. A special MedGuide will be given to you by the pharmacist with each prescription and refill. Be sure to read this information carefully each time. Talk to your pediatrician regarding the use of this medicine in children. Special care may be needed. Overdosage: If you think you have taken too much of this medicine contact a poison control center or emergency room at once. NOTE: This medicine is only for you. Do not share this medicine with others. What if I miss a dose? If you miss a dose, take it as soon as you can. If it is almost time for your next dose, take only that dose. Do not take double or extra doses. What may interact with this medicine? Do not take this medicine with any of the following medications: -certain medicines  for fungal infections like fluconazole, itraconazole, ketoconazole, posaconazole, voriconazole -cisapride -dofetilide -dronedarone -linezolid -MAOIs like Carbex, Eldepryl, Marplan, Nardil, and Parnate -mesoridazine -methylene blue (injected into a vein) -pimozide -saquinavir -thioridazine This medicine may also interact with the following medications: -alcohol -antiviral medicines for HIV or AIDS -aspirin and aspirin-like medicines -barbiturates like phenobarbital -certain medicines for blood pressure, heart disease, irregular heart beat -certain medicines for depression, anxiety, or psychotic disturbances -certain medicines for migraine headache like almotriptan, eletriptan, frovatriptan, naratriptan, rizatriptan, sumatriptan, zolmitriptan -certain medicines for seizures like carbamazepine and phenytoin -certain medicines for sleep -certain medicines that treat or prevent blood clots like dalteparin, enoxaparin, warfarin -digoxin -fentanyl -lithium -NSAIDS, medicines for pain and inflammation, like ibuprofen or naproxen -other medicines that prolong the QT interval (cause an abnormal heart rhythm) -rasagiline -supplements like St. John's wort, kava kava, valerian -tramadol -tryptophan This list may not describe all possible interactions. Give your health care provider a list of all the medicines, herbs, non-prescription drugs, or dietary supplements you use. Also tell them if you smoke, drink alcohol, or use illegal drugs. Some items may interact with your medicine. What should I watch for while using this medicine? Tell your doctor if your symptoms do not get better or if they get worse. Visit your doctor or health care professional for regular checks on your progress. Because it may take several weeks to see the full effects of this medicine, it is important to continue your treatment as prescribed by your doctor. Patients and their families should watch out for new or worsening  thoughts of suicide or depression. Also watch   out for sudden changes in feelings such as feeling anxious, agitated, panicky, irritable, hostile, aggressive, impulsive, severely restless, overly excited and hyperactive, or not being able to sleep. If this happens, especially at the beginning of treatment or after a change in dose, call your health care professional. You may get drowsy or dizzy. Do not drive, use machinery, or do anything that needs mental alertness until you know how this medicine affects you. Do not stand or sit up quickly, especially if you are an older patient. This reduces the risk of dizzy or fainting spells. Alcohol may interfere with the effect of this medicine. Avoid alcoholic drinks. This medicine may cause dry eyes and blurred vision. If you wear contact lenses you may feel some discomfort. Lubricating drops may help. See your eye doctor if the problem does not go away or is severe. Your mouth may get dry. Chewing sugarless gum, sucking hard candy and drinking plenty of water may help. Contact your doctor if the problem does not go away or is severe. What side effects may I notice from receiving this medicine? Side effects that you should report to your doctor or health care professional as soon as possible: -allergic reactions like skin rash, itching or hives, swelling of the face, lips, or tongue -elevated mood, decreased need for sleep, racing thoughts, impulsive behavior -confusion -fast, irregular heartbeat -feeling faint or lightheaded, falls -feeling agitated, angry, or irritable -loss of balance or coordination -painful or prolonged erections -restlessness, pacing, inability to keep still -suicidal thoughts or other mood changes -tremors -trouble sleeping -seizures -unusual bleeding or bruising Side effects that usually do not require medical attention (report to your doctor or health care professional if they continue or are bothersome): -change in sex drive or  performance -change in appetite or weight -constipation -headache -muscle aches or pains -nausea This list may not describe all possible side effects. Call your doctor for medical advice about side effects. You may report side effects to FDA at 1-800-FDA-1088. Where should I keep my medicine? Keep out of the reach of children. Store at room temperature between 15 and 30 degrees C (59 to 86 degrees F). Protect from light. Keep container tightly closed. Throw away any unused medicine after the expiration date. NOTE: This sheet is a summary. It may not cover all possible information. If you have questions about this medicine, talk to your doctor, pharmacist, or health care provider.  2019 Elsevier/Gold Standard (2017-05-05 17:51:24)  

## 2018-05-13 NOTE — Progress Notes (Signed)
[facility] MD OP Progress Note  05/13/2018 12:07 PM Margaret [name]  MRN:  [id number]  Chief Complaint: ' I am here for follow up.' Chief Complaint    Follow-up     HPI: Margaret [name] is a 58 year old Caucasian female who is married, employed, lives in [place], has a history of anxiety disorder, presented to clinic today for a follow-up visit.  Patient today reports she is making progress with regards to her anxiety symptoms.  She reports she is currently not too worried about her health problems.  She had imaging of her brain and it came back within normal limits.  She continues to be under the care of a neurologist for her seizure disorder and is on Depakote.  Patient reports sleep as fair.  Her if she reports her appetite is fair.  She continues to be compliant with her Prozac and Seroquel.  Discussed with patient about tapering off the Seroquel and starting a new medication like trazodone for sleep.  Patient was initially started on Seroquel because of agitation and anxiety and it was started in the emergency department.  Patient reports she would like to try.  Patient denies any suicidality, homicidality or perceptual disturbances. Visit Diagnosis:    ICD-10-CM   1. GAD (generalized anxiety disorder) F41.1 FLUoxetine (PROZAC) 40 MG capsule  2. Insomnia due to medical condition G47.01 traZODone (DESYREL) 50 MG tablet    Past Psychiatric History: I have reviewed past psychiatric history from my progress note on 01/14/2018  Past Medical History:  Past Medical History:  Diagnosis Date  . Anxiety   . Epilepsy ([place])   . Hypothyroidism   . Ovarian cyst     Past Surgical History:  Procedure Laterality Date  . BREAST BIOPSY Left 2012   NEG  . BREAST CYST ASPIRATION Left 2011  . OVARY SURGERY      Family Psychiatric History: Reviewed family psychiatric history from my progress note on 01/15/2028  Family History:  Family History  Problem Relation Age of Onset  . Diabetes Mother   . CAD Father    . Breast cancer Neg Hx     Social History: I have reviewed social history from my progress note on 01/14/2018 Social History   Socioeconomic History  . Marital status: Married    Spouse name: hubert  . Number of children: 2  . Years of education: Not on file  . Highest education level: High school graduate  Occupational History    Comment: part time  Social Needs  . Financial resource strain: Not hard at all  . Food insecurity:    Worry: Never true    Inability: Never true  . Transportation needs:    Medical: No    Non-medical: No  Tobacco Use  . Smoking status: Former Smoker    Types: Cigarettes    Last attempt to quit: 03/06/2011    Years since quitting: 7.1  . Smokeless tobacco: Never Used  Substance and Sexual Activity  . Alcohol use: Yes    Alcohol/week: 8.0 standard drinks    Types: 4 Glasses of wine, 4 Shots of liquor per week  . Drug use: No  . Sexual activity: Yes    Birth control/protection: None  Lifestyle  . Physical activity:    Days per week: 7 days    Minutes per session: 30 min  . Stress: Not at all  Relationships  . Social connections:    Talks on phone: Never    Gets together: Never  Attends religious service: Never    Active member of club or organization: No    Attends meetings of clubs or organizations: Never    Relationship status: Married  Other Topics Concern  . Not on file  Social History Narrative  . Not on file    Allergies:  Allergies  Allergen Reactions  . Keppra [Levetiracetam]   . Lexapro [Escitalopram Oxalate] Nausea Only    Metabolic Disorder Labs: No results found for: HGBA1C, MPG No results found for: PROLACTIN No results found for: CHOL, TRIG, HDL, CHOLHDL, VLDL, LDLCALC Lab Results  Component Value Date   TSH 1.514 10/04/2016    Therapeutic Level Labs: No results found for: LITHIUM Lab Results  Component Value Date   VALPROATE 54 11/18/2017   VALPROATE 49 (L) 05/20/2017   No components found for:   CBMZ  Current Medications: Current Outpatient Medications  Medication Sig Dispense Refill  . divalproex (DEPAKOTE) 250 MG DR tablet Take 2 tablets (500 mg total) by mouth daily. Prescribed by neurologist 60 tablet 0  . FLUoxetine (PROZAC) 40 MG capsule Take 1 capsule (40 mg total) by mouth daily. 90 capsule 1  . hydrOXYzine (VISTARIL) 25 MG capsule Take 1 capsule (25 mg total) by mouth 3 (three) times daily as needed for anxiety. 90 capsule 1  . levothyroxine (SYNTHROID, LEVOTHROID) 75 MCG tablet   5  . loratadine (CLARITIN) 10 MG tablet Take by mouth.    Marland Kitchen LORazepam (ATIVAN) 1 MG tablet Take 0.5 tablets (0.5 mg total) by mouth 2 (two) times daily as needed for anxiety. As needed for severe anxiety 4 tablet 0  . Multiple Vitamin (MULTI-VITAMINS) TABS Take by mouth.    . SYNTHROID 88 MCG tablet Take 88 mcg by mouth daily.  0  . traZODone (DESYREL) 50 MG tablet Take 0.5-1.5 tablets (25-75 mg total) by mouth at bedtime as needed for sleep. 45 tablet 0   No current facility-administered medications for this visit.      Musculoskeletal: Strength & Muscle Tone: within normal limits Gait & Station: normal Patient leans: N/A  Psychiatric Specialty Exam: Review of Systems  Psychiatric/Behavioral: The patient is nervous/anxious (Improved).   All other systems reviewed and are negative.   Blood pressure 125/78, pulse 76, temperature 97.8 F (36.6 C), temperature source Oral, weight 154 lb 3.2 oz (69.9 kg).Body mass index is 29.14 kg/m.  General Appearance: Casual  Eye Contact:  Fair  Speech:  Clear and Coherent  Volume:  Normal  Mood:  Anxious improved  Affect:  Congruent  Thought Process:  Goal Directed and Descriptions of Associations: Intact  Orientation:  Full (Time, Place, and Person)  Thought Content: Logical   Suicidal Thoughts:  No  Homicidal Thoughts:  No  Memory:  Immediate;   Fair Recent;   Fair Remote;   Fair  Judgement:  Fair  Insight:  Fair  Psychomotor Activity:   Normal  Concentration:  Concentration: Fair and Attention Span: Fair  Recall:  [employer] of Knowledge: Fair  Language: Fair  Akathisia:  No  Handed:  Right  AIMS (if indicated): observed abnormal involuntary movements like jerks of her shoulders.  Pt however reports she is not aware of any  Assets:  Communication Skills Desire for Improvement Social Support  ADL's:  Intact  Cognition: WNL  Sleep:  Fair   Screenings:   Assessment and Plan: Diva is a 58 year old Caucasian female who has a history of anxiety disorder, seizure disorder, hypothyroidism, presented to clinic today for a follow-up  visit.  Patient reports she is making progress with regards to her anxiety and sleep.  Discussed discontinuing Seroquel and starting a new sleep aid like trazodone.  Patient agrees with plan.  Plan Anxiety disorder-improving Prozac 40 mg p.o. daily Discontinue Seroquel.  For insomnia- improved Since Seroquel is an antipsychotic medication discussed starting trazodone for sleep. We will start trazodone 25 to 75 mg p.o. nightly as needed.  Follow-up in clinic in 2 weeks or sooner if needed.  I have spent atleast 15 minutes  face to face with patient today. More than 50 % of the time was spent for psychoeducation and supportive psychotherapy and care coordination.  This note was generated in part or whole with voice recognition software. Voice recognition is usually quite accurate but there are transcription errors that can and very often do occur. I apologize for any typographical errors that were not detected and corrected.        Ursula Alert, MD 05/13/2018, 12:07 PM

## 2018-05-24 ENCOUNTER — Telehealth: Payer: Self-pay

## 2018-05-24 NOTE — Telephone Encounter (Signed)
Ok thanks 

## 2018-05-24 NOTE — Telephone Encounter (Signed)
left message that she is sleeping good, medication work fine.

## 2018-05-25 ENCOUNTER — Ambulatory Visit: Payer: BLUE CROSS/BLUE SHIELD | Admitting: Psychiatry

## 2018-06-08 ENCOUNTER — Other Ambulatory Visit: Payer: Self-pay | Admitting: Psychiatry

## 2018-06-08 DIAGNOSIS — G4701 Insomnia due to medical condition: Secondary | ICD-10-CM

## 2018-07-06 ENCOUNTER — Other Ambulatory Visit: Payer: Self-pay | Admitting: Psychiatry

## 2018-07-06 DIAGNOSIS — G4701 Insomnia due to medical condition: Secondary | ICD-10-CM

## 2018-07-18 ENCOUNTER — Other Ambulatory Visit: Payer: Self-pay | Admitting: Psychiatry

## 2018-07-18 DIAGNOSIS — F411 Generalized anxiety disorder: Secondary | ICD-10-CM

## 2018-10-23 ENCOUNTER — Other Ambulatory Visit: Payer: Self-pay | Admitting: Psychiatry

## 2018-10-23 DIAGNOSIS — G4701 Insomnia due to medical condition: Secondary | ICD-10-CM

## 2018-11-02 ENCOUNTER — Other Ambulatory Visit: Payer: Self-pay | Admitting: Internal Medicine

## 2018-11-02 DIAGNOSIS — Z1231 Encounter for screening mammogram for malignant neoplasm of breast: Secondary | ICD-10-CM

## 2018-11-29 ENCOUNTER — Other Ambulatory Visit: Payer: Self-pay | Admitting: Psychiatry

## 2018-11-29 DIAGNOSIS — G4701 Insomnia due to medical condition: Secondary | ICD-10-CM

## 2018-12-03 ENCOUNTER — Ambulatory Visit
Admission: RE | Admit: 2018-12-03 | Discharge: 2018-12-03 | Disposition: A | Discharge status: Home / Self Care | Payer: BLUE CROSS/BLUE SHIELD | Source: Ambulatory Visit | Attending: Internal Medicine | Admitting: Internal Medicine

## 2018-12-03 DIAGNOSIS — Z1231 Encounter for screening mammogram for malignant neoplasm of breast: Secondary | ICD-10-CM | POA: Diagnosis not present

## 2018-12-17 ENCOUNTER — Other Ambulatory Visit: Payer: Self-pay | Admitting: Psychiatry

## 2018-12-17 DIAGNOSIS — G4701 Insomnia due to medical condition: Secondary | ICD-10-CM

## 2019-01-20 ENCOUNTER — Other Ambulatory Visit: Payer: Self-pay | Admitting: Psychiatry

## 2019-01-20 DIAGNOSIS — G4701 Insomnia due to medical condition: Secondary | ICD-10-CM

## 2019-01-28 ENCOUNTER — Other Ambulatory Visit: Payer: Self-pay | Admitting: Psychiatry

## 2019-01-28 DIAGNOSIS — F411 Generalized anxiety disorder: Secondary | ICD-10-CM

## 2019-01-28 MED ORDER — FLUOXETINE HCL 40 MG PO CAPS: ORAL_CAPSULE | ORAL | 0 refills | Status: DC

## 2019-01-28 NOTE — Telephone Encounter (Signed)
Will sent 30 days supply of Prozac, patient needs appointment.

## 2019-02-17 ENCOUNTER — Other Ambulatory Visit: Payer: Self-pay | Admitting: Psychiatry

## 2019-02-17 DIAGNOSIS — G4701 Insomnia due to medical condition: Secondary | ICD-10-CM

## 2019-03-24 ENCOUNTER — Other Ambulatory Visit: Payer: Self-pay | Admitting: Psychiatry

## 2019-03-24 DIAGNOSIS — G4701 Insomnia due to medical condition: Secondary | ICD-10-CM

## 2019-03-25 NOTE — Telephone Encounter (Signed)
Sent Trazodone to pharmacy - however pt needs appointment prior to future refills.

## 2019-04-16 ENCOUNTER — Other Ambulatory Visit: Payer: Self-pay | Admitting: Psychiatry

## 2019-04-16 DIAGNOSIS — G4701 Insomnia due to medical condition: Secondary | ICD-10-CM

## 2019-05-17 ENCOUNTER — Other Ambulatory Visit: Payer: Self-pay | Admitting: Psychiatry

## 2019-05-17 DIAGNOSIS — F411 Generalized anxiety disorder: Secondary | ICD-10-CM

## 2019-05-25 ENCOUNTER — Other Ambulatory Visit: Payer: Self-pay | Admitting: Psychiatry

## 2019-05-25 DIAGNOSIS — G4701 Insomnia due to medical condition: Secondary | ICD-10-CM

## 2019-05-26 ENCOUNTER — Telehealth: Payer: Self-pay | Admitting: Psychiatry

## 2019-05-26 DIAGNOSIS — G4701 Insomnia due to medical condition: Secondary | ICD-10-CM

## 2019-05-26 MED ORDER — TRAZODONE HCL 50 MG PO TABS: ORAL_TABLET | ORAL | 0 refills | Status: DC

## 2019-05-26 NOTE — Telephone Encounter (Signed)
Patient scheduled appointment for April per staff -Lea. We will send trazodone to [facility] for 30-day supply.  Called patient and verified pharmacy.

## 2019-05-26 NOTE — Telephone Encounter (Signed)
This patient was last seen a year ago . I cannot send refills unless she has an appointment .Otherwise she can talk to her PMD .

## 2019-06-13 ENCOUNTER — Ambulatory Visit (INDEPENDENT_AMBULATORY_CARE_PROVIDER_SITE_OTHER): Payer: BC Managed Care – PPO | Admitting: Psychiatry

## 2019-06-13 ENCOUNTER — Other Ambulatory Visit: Payer: Self-pay

## 2019-06-13 ENCOUNTER — Encounter: Payer: Self-pay | Admitting: Psychiatry

## 2019-06-13 DIAGNOSIS — F411 Generalized anxiety disorder: Secondary | ICD-10-CM

## 2019-06-13 DIAGNOSIS — G4701 Insomnia due to medical condition: Secondary | ICD-10-CM | POA: Diagnosis not present

## 2019-06-13 MED ORDER — TRAZODONE HCL 50 MG PO TABS: ORAL_TABLET | ORAL | 1 refills | Status: DC

## 2019-06-13 MED ORDER — FLUOXETINE HCL 40 MG PO CAPS: ORAL_CAPSULE | ORAL | 1 refills | Status: DC

## 2019-06-13 NOTE — Progress Notes (Signed)
Provider Location : [facility] Patient Location : Home  Virtual Visit via Video Note  I connected with Margaret [name] on 06/13/19 at  3:20 PM EDT by a video enabled telemedicine application and verified that I am speaking with the correct person using two identifiers.   I discussed the limitations of evaluation and management by telemedicine and the availability of in person appointments. The patient expressed understanding and agreed to proceed.    I discussed the assessment and treatment plan with the patient. The patient was provided an opportunity to ask questions and all were answered. The patient agreed with the plan and demonstrated an understanding of the instructions.   The patient was advised to call back or seek an in-person evaluation if the symptoms worsen or if the condition fails to improve as anticipated.   [place] MD OP Progress Note  06/13/2019 5:45 PM Margaret [name]  MRN:  [id number]  Chief Complaint:  Chief Complaint    Follow-up     HPI: Margaret [name] is a 59 year old Caucasian female who is married, employed, lives in [place], has a history of anxiety disorder was evaluated by telemedicine today.  Patient today reports she is currently doing well on the current medication regimen.  She is compliant on the Prozac and the trazodone.  She reports anxiety and depressive symptoms are stable.  She reports sleep is good.  She denies side effects to medications.  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  She was able to take a short vacation to [place] with her husband which went well.  She reports that towards the end of the vacation her husband's brother who was 31 years old passed away immediately.  She however reports she was able to support her husband through all that.  Patient denies any suicidality, homicidality or perceptual disturbances.  She reports she was able to get her COVID-19 vaccine and she tolerated it well.  Patient  denies any other concerns today. Visit Diagnosis:    ICD-10-CM   1. GAD (generalized anxiety disorder)  F41.1 FLUoxetine (PROZAC) 40 MG capsule  2. Insomnia due to medical condition  G47.01 traZODone (DESYREL) 50 MG tablet    Past Psychiatric History: I have reviewed past psychiatric history from my progress note on 01/14/2018  Past Medical History:  Past Medical History:  Diagnosis Date  . Anxiety   . Epilepsy ([place])   . Hypothyroidism   . Ovarian cyst     Past Surgical History:  Procedure Laterality Date  . BREAST BIOPSY Left 2012   NEG  . BREAST CYST ASPIRATION Left 2011  . OVARY SURGERY      Family Psychiatric History: I have reviewed family psychiatric history from my progress note on 01/14/2018  Family History:  Family History  Problem Relation Age of Onset  . Diabetes Mother   . CAD Father   . Breast cancer Neg Hx     Social History: I have reviewed social history from my progress note on 01/14/2018 Social History   Socioeconomic History  . Marital status: Married    Spouse name: hubert  . Number of children: 2  . Years of education: Not on file  . Highest education level: High school graduate  Occupational History    Comment: part time  Tobacco Use  . Smoking status: Former Smoker    Types: Cigarettes    Quit date: 03/06/2011    Years since quitting: 8.2  . Smokeless  tobacco: Never Used  Substance and Sexual Activity  . Alcohol use: Yes    Alcohol/week: 8.0 standard drinks    Types: 4 Glasses of wine, 4 Shots of liquor per week  . Drug use: No  . Sexual activity: Yes    Birth control/protection: None  Other Topics Concern  . Not on file  Social History Narrative  . Not on file   Social Determinants of Health   Financial Resource Strain:   . Difficulty of Paying Living Expenses:   Food Insecurity:   . Worried About Charity fundraiser in the Last Year:   . Arboriculturist in the Last Year:   Transportation Needs:   . Film/video editor  (Medical):   Marland Kitchen Lack of Transportation (Non-Medical):   Physical Activity:   . Days of Exercise per Week:   . Minutes of Exercise per Session:   Stress:   . Feeling of Stress :   Social Connections:   . Frequency of Communication with Friends and Family:   . Frequency of Social Gatherings with Friends and Family:   . Attends Religious Services:   . Active Member of Clubs or Organizations:   . Attends Archivist Meetings:   Marland Kitchen Marital Status:     Allergies:  Allergies  Allergen Reactions  . Keppra [Levetiracetam]   . Lexapro [Escitalopram Oxalate] Nausea Only    Metabolic Disorder Labs: No results found for: HGBA1C, MPG No results found for: PROLACTIN No results found for: CHOL, TRIG, HDL, CHOLHDL, VLDL, LDLCALC Lab Results  Component Value Date   TSH 1.514 10/04/2016    Therapeutic Level Labs: No results found for: LITHIUM Lab Results  Component Value Date   VALPROATE 54 11/18/2017   VALPROATE 49 (L) 05/20/2017   No components found for:  CBMZ  Current Medications: Current Outpatient Medications  Medication Sig Dispense Refill  . divalproex (DEPAKOTE) 250 MG DR tablet Take 2 tablets (500 mg total) by mouth daily. Prescribed by neurologist 60 tablet 0  . FLUoxetine (PROZAC) 40 MG capsule TAKE 1 CAPSULE(40 MG) BY MOUTH DAILY 90 capsule 1  . hydrOXYzine (VISTARIL) 25 MG capsule Take 1 capsule (25 mg total) by mouth 3 (three) times daily as needed for anxiety. 90 capsule 1  . levothyroxine (SYNTHROID) 75 MCG tablet TAKE 1 TABLET(75 MCG) BY MOUTH EVERY DAY 30 TO 60 MINUTES BEFORE BREAKFAST ON AN EMPTY STOMACH AND WITH A GLASS OF WATER    . levothyroxine (SYNTHROID, LEVOTHROID) 75 MCG tablet   5  . loratadine (CLARITIN) 10 MG tablet Take by mouth.    . Multiple Vitamin (MULTI-VITAMINS) TABS Take by mouth.    . traZODone (DESYREL) 50 MG tablet TAKE 1/2 TO 1 AND 1/2 TABLETS(25 TO 75 MG) BY MOUTH AT BEDTIME AS NEEDED FOR SLEEP 135 tablet 1   No current  facility-administered medications for this visit.     Musculoskeletal: Strength & Muscle Tone: UTA Gait & Station: normal Patient leans: N/A  Psychiatric Specialty Exam: Review of Systems  Psychiatric/Behavioral: Negative for agitation, behavioral problems, confusion, decreased concentration, dysphoric mood, hallucinations, self-injury, sleep disturbance and suicidal ideas. The patient is not nervous/anxious and is not hyperactive.   All other systems reviewed and are negative.   There were no vitals taken for this visit.There is no height or weight on file to calculate BMI.  General Appearance: Casual  Eye Contact:  Fair  Speech:  Clear and Coherent  Volume:  Normal  Mood:  Euthymic  Affect:  Congruent  Thought Process:  Goal Directed and Descriptions of Associations: Intact  Orientation:  Full (Time, Place, and Person)  Thought Content: Logical   Suicidal Thoughts:  No  Homicidal Thoughts:  No  Memory:  Immediate;   Fair Recent;   Fair Remote;   Fair  Judgement:  Fair  Insight:  Fair  Psychomotor Activity:  Normal  Concentration:  Concentration: Fair and Attention Span: Fair  Recall:  [employer] of Knowledge: Fair  Language: Fair  Akathisia:  No  Handed:  Right  AIMS (if indicated): UTA  Assets:  Communication Skills Desire for Improvement Housing Intimacy Social Support Talents/Skills Transportation Vocational/Educational  ADL's:  Intact  Cognition: WNL  Sleep:  Fair   Screenings:   Assessment and Plan: Margaret [name] is a 59 year old Caucasian female who has a history of anxiety disorder, seizure disorder, hypothyroidism was evaluated by telemedicine today.  Patient is currently stable on current medication regimen.  Plan as noted below.  Plan GAD-stable Prozac 40 mg p.o. daily  Insomnia-stable Trazodone 50 to 75 mg p.o. nightly as needed  Follow-up in clinic in 6 months or sooner if needed.  I have spent atleast 20 minutes non face to face with patient  today. More than 50 % of the time was spent for preparing to see the patient ( e.g., review of test, records ), ordering medications,psychoeducation and supportive psychotherapy and care coordination,as well as documenting clinical information in electronic health record. This note was generated in part or whole with voice recognition software. Voice recognition is usually quite accurate but there are transcription errors that can and very often do occur. I apologize for any typographical errors that were not detected and corrected.       Ursula Alert, MD 06/13/2019, 5:45 PM

## 2019-07-05 ENCOUNTER — Ambulatory Visit: Payer: BC Managed Care – PPO | Admitting: Psychiatry

## 2019-07-16 DIAGNOSIS — R6889 Other general symptoms and signs: Secondary | ICD-10-CM | POA: Insufficient documentation

## 2019-09-05 ENCOUNTER — Encounter: Payer: Self-pay | Admitting: *Deleted

## 2019-09-05 ENCOUNTER — Emergency Department
Admission: EM | Admit: 2019-09-05 | Discharge: 2019-09-05 | Disposition: A | Discharge status: Home / Self Care | Payer: BC Managed Care – PPO | Attending: Emergency Medicine | Admitting: Emergency Medicine

## 2019-09-05 ENCOUNTER — Emergency Department: Payer: BC Managed Care – PPO

## 2019-09-05 ENCOUNTER — Other Ambulatory Visit: Payer: Self-pay

## 2019-09-05 DIAGNOSIS — R110 Nausea: Secondary | ICD-10-CM | POA: Insufficient documentation

## 2019-09-05 DIAGNOSIS — R0789 Other chest pain: Secondary | ICD-10-CM | POA: Insufficient documentation

## 2019-09-05 LAB — TROPONIN I (HIGH SENSITIVITY): Troponin I (High Sensitivity): 10 ng/L (ref <18)

## 2019-09-05 LAB — CBC
HCT: 38.7 % (ref 36.0–46.0)
Hemoglobin: 13 g/dL (ref 12.0–15.0)
MCH: 31.3 pg (ref 26.0–34.0)
MCHC: 33.6 g/dL (ref 30.0–36.0)
MCV: 93.3 fL (ref 80.0–100.0)
Platelets: 283 10*3/uL (ref 150–400)
RBC: 4.15 MIL/uL (ref 3.87–5.11)
RDW: 13.6 % (ref 11.5–15.5)
WBC: 10.6 10*3/uL — ABNORMAL HIGH (ref 4.0–10.5)
nRBC: 0 % (ref 0.0–0.2)

## 2019-09-05 LAB — BASIC METABOLIC PANEL
Anion gap: 14 (ref 5–15)
BUN: 12 mg/dL (ref 6–20)
CO2: 24 mmol/L (ref 22–32)
Calcium: 9.2 mg/dL (ref 8.9–10.3)
Chloride: 100 mmol/L (ref 98–111)
Creatinine, Ser: 0.89 mg/dL (ref 0.44–1.00)
GFR calc Af Amer: >60 mL/min (ref >60)
GFR calc non Af Amer: >60 mL/min (ref >60)
Glucose, Bld: 90 mg/dL (ref 70–99)
Potassium: 3.5 mmol/L (ref 3.5–5.1)
Sodium: 138 mmol/L (ref 135–145)

## 2019-09-05 MED ORDER — SODIUM CHLORIDE 0.9% FLUSH: 3.0000 mL | Freq: Once | INTRAVENOUS | Status: DC

## 2019-09-05 NOTE — ED Triage Notes (Signed)
Reports onset of intermittent, central chest pain, describes as pressure started about 1pm today, associated with nausea.

## 2019-09-06 ENCOUNTER — Telehealth: Payer: Self-pay | Admitting: Emergency Medicine

## 2019-09-06 NOTE — Telephone Encounter (Signed)
Called patient due to lwot to inquire about condition and follow up plans. Says she looked at [facility] and everything okay.  I encouraged her to call her pcp to inform them of her symptoms.  expained that her results do not mean that her pain was not caused by her heart.

## 2019-11-21 ENCOUNTER — Other Ambulatory Visit: Payer: Self-pay | Admitting: Psychiatry

## 2019-11-21 DIAGNOSIS — G4701 Insomnia due to medical condition: Secondary | ICD-10-CM

## 2019-12-07 ENCOUNTER — Other Ambulatory Visit: Payer: Self-pay | Admitting: Internal Medicine

## 2019-12-07 DIAGNOSIS — Z1231 Encounter for screening mammogram for malignant neoplasm of breast: Secondary | ICD-10-CM

## 2019-12-09 ENCOUNTER — Other Ambulatory Visit: Payer: Self-pay

## 2019-12-09 ENCOUNTER — Ambulatory Visit
Admission: RE | Admit: 2019-12-09 | Discharge: 2019-12-09 | Disposition: A | Discharge status: Home / Self Care | Payer: BC Managed Care – PPO | Source: Ambulatory Visit | Attending: Internal Medicine | Admitting: Internal Medicine

## 2019-12-09 DIAGNOSIS — Z1231 Encounter for screening mammogram for malignant neoplasm of breast: Secondary | ICD-10-CM | POA: Diagnosis present

## 2019-12-12 ENCOUNTER — Other Ambulatory Visit: Payer: Self-pay

## 2019-12-12 ENCOUNTER — Encounter: Payer: Self-pay | Admitting: Psychiatry

## 2019-12-12 ENCOUNTER — Telehealth (INDEPENDENT_AMBULATORY_CARE_PROVIDER_SITE_OTHER): Payer: BC Managed Care – PPO | Admitting: Psychiatry

## 2019-12-12 DIAGNOSIS — G4701 Insomnia due to medical condition: Secondary | ICD-10-CM

## 2019-12-12 DIAGNOSIS — F411 Generalized anxiety disorder: Secondary | ICD-10-CM

## 2019-12-12 DIAGNOSIS — F4010 Social phobia, unspecified: Secondary | ICD-10-CM

## 2019-12-12 MED ORDER — FLUOXETINE HCL 40 MG PO CAPS: ORAL_CAPSULE | ORAL | 1 refills | Status: DC

## 2019-12-12 MED ORDER — TRAZODONE HCL 50 MG PO TABS: 25.0000 mg | ORAL_TABLET | Freq: Every evening | ORAL | 1 refills | Status: DC | PRN

## 2019-12-12 NOTE — Patient Instructions (Signed)
Social Anxiety Disorder, Adult Social anxiety disorder (SAD), previously called social phobia, is a mental health condition. People with SAD often feel nervous, afraid, or embarrassed when they are around other people in social situations. They worry that other people are judging or criticizing them for how they look, what they say, or how they act. SAD involves more than just feeling shy or self-conscious at times. It can cause severe emotional distress. It can interfere with activities of daily life. SAD also may lead to alcohol or drug use, and even suicide. SAD is a common mental health condition. It can develop at any time, but it usually starts in the teenage years. What are the causes? The cause of this condition is not known. It may involve genes that are passed through families. Stressful events may trigger anxiety. This disorder is also associated with an overactive amygdala. The amygdala is the part of the brain that triggers your response to strong feelings, such as fear. What increases the risk? This condition is more likely to develop in:  People who have a family history of anxiety disorders.  Women.  People who have a physical or behavioral condition that makes them feel self-conscious or nervous, such as a stutter or a long-term (chronic) disease. What are the signs or symptoms? The main symptom of this condition is fear of embarrassment caused by being criticized or judged in social situations. You may be afraid to:  Speak in public.  Go shopping.  Use a public bathroom.  Eat at a restaurant.  Go to work.  Interact with people you do not know. Extreme fear and anxiety may cause physical symptoms, including:  Blushing.  A fast heartbeat.  Sweating.  Shaky hands or voice.  Confusion.  Light-headedness.  Upset stomach, diarrhea, or vomiting.  Shortness of breath. How is this diagnosed? This condition is diagnosed based on your history, symptoms, and  behavior in social situations. You may be diagnosed with this type of anxiety if your symptoms have lasted for more than 6 months and have been present on more days than not. Your health care provider may ask you about your use of alcohol, drugs, and prescription medicines. He or she may also refer you to a mental health specialist for further evaluation or treatment. How is this treated? Treatment for this condition may include:  Cognitive behavioral therapy (CBT). This type of talk therapy helps you learn to replace negative thoughts and behaviors with positive ones. This may include learning how to use self-calming skills and other methods of managing your anxiety.  Exposure therapy. You will be exposed to social situations that cause you fear. The treatment starts with practicing self-calming in situations that cause you low levels of fear. Over time, you will progress by sustaining self-calming and managing harder situations.  Antidepressant medicines. These medicines may be used by themselves or in addition to other therapies.  Biofeedback. This process trains you to manage your body's response (physiological response) through breathing techniques and relaxation methods. You will work with a therapist while machines are used to monitor your physical symptoms.  Techniques for relaxation and managing anxiety. These include deep breathing, self-talk, meditation, visual imagery, muscle relaxation, music therapy, and yoga. These techniques are often used with other therapies to keep you calm in situations that cause you anxiety. These treatments are often used in combination. Follow these instructions at home: Alcohol use If you drink alcohol:  Limit how much you use to: ? 0-1 drink a day for  nonpregnant women. ? 0-2 drinks a day for men.  Be aware of how much alcohol is in your drink. In the [place] one drink equals one 12 oz bottle of beer (355 mL), one 5 oz glass of wine (148 mL), or one 1  oz glass of hard liquor (44 mL). General instructions  Take over-the-counter and prescription medicines only as told by your health care provider.  Practice techniques for relaxation and managing anxiety at times you are not challenged by social anxiety.  Return to social activities using techniques you have learned, as you feel ready to do so.  Avoid caffeine and certain over-the-counter cold medicines. These may make you feel worse. Ask your pharmacists which medicines to avoid.  Keep all follow-up visits as told by your health care provider. This is important. Where to find more information  [employer] on Mental Illness (NAMI): https://www.nami.org  Social Anxiety Association: https://socialphobia.[facility] [employer].no  Anxiety and Depression Association of [place] (ADAA): FeeTelevision.cz Contact a health care provider if:  Your symptoms do not improve or get worse.  You have signs of depression, such as: ? Persistent sadness or moodiness. ? Loss of enjoyment in activities that used to bring you joy. ? Change in weight or eating. ? Changes in sleeping habits. ? Avoiding friends or family members more than usual. ? Loss of energy for normal tasks. ? Feeling guilty or worthless.  You become more isolated than you normally are.  You find it more and more difficult to speak or interact with others.  You are using drugs.  You are drinking more alcohol than usual. Get help right away if:  You harm yourself.  You have suicidal thoughts. If you ever feel like you may hurt yourself or others, or have thoughts about taking your own life, get help right away. You can go to your nearest emergency department or call:  Your local emergency services (911 in the [place]  A suicide crisis helpline, such as the [place] at (951)424-8045. This is open 24 hours a day. Summary  Social anxiety disorder  (SAD) may cause you to feel nervous, afraid, or embarrassed when you are around other people in social situations.  SAD is a common mental disorder. It can develop at any time, but it usually starts in the teenage years.  Treatment includes talk therapy, exposure therapy, medicines, biofeedback, and relaxation techniques. It can involve a combination of treatments. This information is not intended to replace advice given to you by your health care provider. Make sure you discuss any questions you have with your health care provider. Document Revised: 07/28/2018 Document Reviewed: 07/28/2018 Elsevier Patient Education  [place].

## 2019-12-12 NOTE — Progress Notes (Signed)
Provider Location : [facility] Patient Location : Home  Participants: Patient , Provider  Virtual Visit via Video Note  I connected with Margaret [name] on 12/12/19 at  2:00 PM EDT by a video enabled telemedicine application and verified that I am speaking with the correct person using two identifiers.   I discussed the limitations of evaluation and management by telemedicine and the availability of in person appointments. The patient expressed understanding and agreed to proceed.    I discussed the assessment and treatment plan with the patient. The patient was provided an opportunity to ask questions and all were answered. The patient agreed with the plan and demonstrated an understanding of the instructions.   The patient was advised to call back or seek an in-person evaluation if the symptoms worsen or if the condition fails to improve as anticipated.   [facility] MD OP Progress Note  12/12/2019 2:22 PM Margaret [name]  MRN:  [id number]  Chief Complaint:  Chief Complaint    Follow-up     HPI: Margaret [name] is a 59 year old Caucasian female who is married, employed, lives in [place], has a history of anxiety disorder, insomnia was evaluated by telemedicine today.  Patient today reports she is currently doing well with regards to her generalized anxiety.  She however reports she has noticed some anxiety in social situations.  She reports for example she say something to someone and then she constantly worries about whether she said the right thing or not.  She also reports a lot of anxiety when she is in public situations.  This has been going on since the past several months.  She however reports she is currently able to cope with it.  She is not interested in changing her medications at this time.  She wants to be more mindful and try to work through it.  She also is not interested in psychotherapy referral and will let writer know if she changes her mind.  She reports sleep is good.  Patient  denies any suicidality, homicidality or perceptual disturbances.  Patient reports she is not compliant with her Prozac.  She reports she was trying to wean herself off of it.  She however realized that she could not do that and wants to stay on it for now.  She agrees to stay compliant on her medication.  Patient denies any other concerns today.  Visit Diagnosis:    ICD-10-CM   1. GAD (generalized anxiety disorder)  F41.1 FLUoxetine (PROZAC) 40 MG capsule  2. Insomnia due to medical condition  G47.01 traZODone (DESYREL) 50 MG tablet  3. Social anxiety disorder  F40.10     Past Psychiatric History: I have reviewed past psychiatric history from my progress note on 01/14/2018  Past Medical History:  Past Medical History:  Diagnosis Date  . Anxiety   . Epilepsy ([place])   . Hypothyroidism   . Ovarian cyst     Past Surgical History:  Procedure Laterality Date  . BREAST BIOPSY Left 2012   NEG  . BREAST CYST ASPIRATION Left 2011  . OVARY SURGERY      Family Psychiatric History: I have reviewed family psychiatric history from my progress note on 01/14/2018  Family History:  Family History  Problem Relation Age of Onset  . Diabetes Mother   . CAD Father   . Breast cancer Neg Hx     Social History: I have reviewed social history from my progress note on 01/14/2018 Social History   Socioeconomic History  .  Marital status: Married    Spouse name: Margaret [name]  . Number of children: 2  . Years of education: Not on file  . Highest education level: High school graduate  Occupational History    Comment: part time  Tobacco Use  . Smoking status: Former Smoker    Types: Cigarettes    Quit date: 03/06/2011    Years since quitting: 8.7  . Smokeless tobacco: Never Used  Vaping Use  . Vaping Use: Never used  Substance and Sexual Activity  . Alcohol use: Yes    Alcohol/week: 8.0 standard drinks    Types: 4 Glasses of wine, 4 Shots of liquor per week  . Drug use: No  . Sexual activity:  Yes    Birth control/protection: None  Other Topics Concern  . Not on file  Social History Narrative  . Not on file   Social Determinants of Health   Financial Resource Strain:   . Difficulty of Paying Living Expenses: Not on file  Food Insecurity:   . Worried About Charity fundraiser in the Last Year: Not on file  . Ran Out of Food in the Last Year: Not on file  Transportation Needs:   . Lack of Transportation (Medical): Not on file  . Lack of Transportation (Non-Medical): Not on file  Physical Activity:   . Days of Exercise per Week: Not on file  . Minutes of Exercise per Session: Not on file  Stress:   . Feeling of Stress : Not on file  Social Connections:   . Frequency of Communication with Friends and Family: Not on file  . Frequency of Social Gatherings with Friends and Family: Not on file  . Attends Religious Services: Not on file  . Active Member of Clubs or Organizations: Not on file  . Attends Archivist Meetings: Not on file  . Marital Status: Not on file    Allergies:  Allergies  Allergen Reactions  . Keppra [Levetiracetam]   . Lexapro [Escitalopram Oxalate] Nausea Only    Metabolic Disorder Labs: No results found for: HGBA1C, MPG No results found for: PROLACTIN No results found for: CHOL, TRIG, HDL, CHOLHDL, VLDL, LDLCALC Lab Results  Component Value Date   TSH 1.514 10/04/2016    Therapeutic Level Labs: No results found for: LITHIUM Lab Results  Component Value Date   VALPROATE 54 11/18/2017   VALPROATE 49 (L) 05/20/2017   No components found for:  CBMZ  Current Medications: Current Outpatient Medications  Medication Sig Dispense Refill  . divalproex (DEPAKOTE) 250 MG DR tablet Take 2 tablets (500 mg total) by mouth daily. Prescribed by neurologist 60 tablet 0  . FLUoxetine (PROZAC) 40 MG capsule TAKE 1 CAPSULE(40 MG) BY MOUTH DAILY 90 capsule 1  . hydrOXYzine (VISTARIL) 25 MG capsule Take 1 capsule (25 mg total) by mouth 3 (three)  times daily as needed for anxiety. 90 capsule 1  . levothyroxine (SYNTHROID) 75 MCG tablet TAKE 1 TABLET(75 MCG) BY MOUTH EVERY DAY 30 TO 60 MINUTES BEFORE BREAKFAST ON AN EMPTY STOMACH AND WITH A GLASS OF WATER    . levothyroxine (SYNTHROID, LEVOTHROID) 75 MCG tablet   5  . loratadine (CLARITIN) 10 MG tablet Take by mouth.    . Multiple Vitamin (MULTI-VITAMINS) TABS Take by mouth.    . traZODone (DESYREL) 50 MG tablet Take 0.5-1 tablets (25-50 mg total) by mouth at bedtime as needed for sleep. 90 tablet 1   No current facility-administered medications for this visit.  Musculoskeletal: Strength & Muscle Tone: UTA Gait & Station: normal Patient leans: N/A  Psychiatric Specialty Exam: Review of Systems  Psychiatric/Behavioral: The patient is nervous/anxious.   All other systems reviewed and are negative.   There were no vitals taken for this visit.There is no height or weight on file to calculate BMI.  General Appearance: Casual  Eye Contact:  Fair  Speech:  Clear and Coherent  Volume:  Normal  Mood:  Anxious  Affect:  Congruent  Thought Process:  Goal Directed and Descriptions of Associations: Intact  Orientation:  Full (Time, Place, and Person)  Thought Content: Logical   Suicidal Thoughts:  No  Homicidal Thoughts:  No  Memory:  Immediate;   Fair Recent;   Fair Remote;   Fair  Judgement:  Fair  Insight:  Fair  Psychomotor Activity:  Normal  Concentration:  Concentration: Fair and Attention Span: Fair  Recall:  [employer] of Knowledge: Fair  Language: Fair  Akathisia:  No  Handed:  Right  AIMS (if indicated): UTA  Assets:  Communication Skills Desire for Improvement Housing Social Support  ADL's:  Intact  Cognition: WNL  Sleep:  Fair   Screenings:   Assessment and Plan: Margaret [name] is a 59 year old Caucasian female who has a history of anxiety disorder, seizure disorder, hypothyroidism was evaluated by telemedicine today.  Patient is currently struggling  with social anxiety symptoms.  She will benefit from the following plan.  Plan GAD-stable Prozac 40 mg p.o. daily  Insomnia-stable Trazodone 50 to 75 mg p.o. nightly as needed  Social anxiety disorder-unstable Discussed referral for CBT.  She declines.  She will let writer know if she is interested. Discussed adding propranolol-she will let writer know and is not interested. Advised patient to stay compliant on the Prozac, she has been noncompliant.  Follow-up in clinic in 2 months or sooner if needed.  I have spent atleast 20 minutes face to face by video with patient today. More than 50 % of the time was spent for preparing to see the patient ( e.g., review of test, records ),  ordering medications and test ,psychoeducation and supportive psychotherapy and care coordination,as well as documenting clinical information in electronic health record. This note was generated in part or whole with voice recognition software. Voice recognition is usually quite accurate but there are transcription errors that can and very often do occur. I apologize for any typographical errors that were not detected and corrected.        Ursula Alert, MD 12/13/2019, 8:23 AM

## 2019-12-22 ENCOUNTER — Other Ambulatory Visit: Payer: Self-pay | Admitting: Psychiatry

## 2019-12-22 DIAGNOSIS — G4701 Insomnia due to medical condition: Secondary | ICD-10-CM

## 2020-02-09 ENCOUNTER — Other Ambulatory Visit: Payer: Self-pay

## 2020-02-09 ENCOUNTER — Telehealth: Payer: BC Managed Care – PPO | Admitting: Psychiatry

## 2020-09-20 ENCOUNTER — Encounter: Payer: Self-pay | Admitting: *Deleted

## 2020-09-23 ENCOUNTER — Other Ambulatory Visit: Payer: Self-pay | Admitting: Psychiatry

## 2020-09-23 DIAGNOSIS — G4701 Insomnia due to medical condition: Secondary | ICD-10-CM

## 2020-11-19 ENCOUNTER — Other Ambulatory Visit: Payer: Self-pay | Admitting: Internal Medicine

## 2020-11-19 DIAGNOSIS — Z1231 Encounter for screening mammogram for malignant neoplasm of breast: Secondary | ICD-10-CM

## 2020-12-12 ENCOUNTER — Other Ambulatory Visit: Payer: Self-pay | Admitting: Psychiatry

## 2020-12-12 DIAGNOSIS — F411 Generalized anxiety disorder: Secondary | ICD-10-CM

## 2020-12-27 ENCOUNTER — Ambulatory Visit: Payer: BC Managed Care – PPO | Admitting: Gastroenterology

## 2021-01-07 NOTE — Progress Notes (Signed)
Gastroenterology Consultation  Referring Provider:     Baxter Hire, MD Primary Care Physician:  Baxter Hire, MD Primary Gastroenterologist:  Dr. Allen Norris     Reason for Consultation:     Abnormal liver enzymes        HPI:   Margaret [name] is a 60 y.o. y/o female referred for consultation & management of Abnormal liver enzymes by Dr. Edwina Barth, Chrystie Nose, MD.  This patient comes see me today after having a colonoscopy by Dr. Gustavo Lah back in 2014 with a hyperplastic polyp found. The patient's liver enzymes have been elevated since 2019 and had shown:  Component 09/18/20 02/29/20  08/29/19  09/13/18  12/30/17  06/09/17  AST  91 High  86 High  84 High  61 High  72 High  68 High   ALT  148 High  134 High  138 High  104 High  106 High  118 High   Alk Phos (alkaline Phosphatase) 898 High  738 High  815 High  588 High  464 High  480 High    It does not appear the patient has ever seen GI before for these abnormal liver enzymes.  The patient denies any itching or any report of any dark urine.  She also denies any family history of liver disease and also denies any alcohol abuse.  The patient states that she had elevated liver enzymes many years ago when she was only on thyroid medication therefore she does not believe that any of the medication she is taking now.  She reports that prior to having the abnormal liver enzymes she does not recall ever having her liver enzymes checked by her previous primary care provider so she is not sure how long her liver enzymes have been elevated.   Past Medical History:  Diagnosis Date   Anxiety    Epilepsy ([place])    Hypothyroidism    Ovarian cyst     Past Surgical History:  Procedure Laterality Date   BREAST BIOPSY Left 2012   NEG   BREAST CYST ASPIRATION Left 2011   OVARY SURGERY      Prior to Admission medications   Medication Sig Start Date End Date Taking? Authorizing Provider  divalproex (DEPAKOTE) 250 MG DR tablet Take 2 tablets (500 mg  total) by mouth daily. Prescribed by neurologist 05/13/18   Ursula Alert, MD  FLUoxetine (PROZAC) 40 MG capsule TAKE 1 CAPSULE(40 MG) BY MOUTH DAILY 12/12/20   Ursula Alert, MD  hydrOXYzine (VISTARIL) 25 MG capsule Take 1 capsule (25 mg total) by mouth 3 (three) times daily as needed for anxiety. 03/05/17   Ursula Alert, MD  levothyroxine (SYNTHROID) 75 MCG tablet TAKE 1 TABLET(75 MCG) BY MOUTH EVERY DAY 30 TO 60 MINUTES BEFORE BREAKFAST ON AN EMPTY STOMACH AND WITH A GLASS OF WATER 02/15/19   [provider]  levothyroxine (SYNTHROID, LEVOTHROID) 75 MCG tablet  12/18/17   [provider]  loratadine (CLARITIN) 10 MG tablet Take by mouth.    [provider]  Multiple Vitamin (MULTI-VITAMINS) TABS Take by mouth.    [provider]  traZODone (DESYREL) 50 MG tablet TAKE 1/2 TO 1 AND 1/2 TABLETS(25 TO 75 MG) BY MOUTH AT BEDTIME AS NEEDED FOR SLEEP 12/22/19   Ursula Alert, MD    Family History  Problem Relation Age of Onset   Diabetes Mother    CAD Father    Breast cancer Neg Hx      Social History  Tobacco Use   Smoking status: Former    Types: Cigarettes    Quit date: 03/06/2011    Years since quitting: 9.8   Smokeless tobacco: Never  Vaping Use   Vaping Use: Never used  Substance Use Topics   Alcohol use: Yes    Alcohol/week: 8.0 standard drinks    Types: 4 Glasses of wine, 4 Shots of liquor per week   Drug use: No    Allergies as of 01/08/2021 - Review Complete 12/12/2019  Allergen Reaction Noted   Keppra [levetiracetam]  11/18/2017   Lexapro [escitalopram oxalate] Nausea Only 05/19/2017    Review of Systems:    All systems reviewed and negative except where noted in HPI.   Physical Exam:  There were no vitals taken for this visit. No LMP recorded. Patient is postmenopausal. General:   Alert,  Well-developed, well-nourished, pleasant and cooperative in NAD Head:  Normocephalic and atraumatic. Eyes:  Sclera clear, no icterus.    Conjunctiva pink. Ears:  Normal auditory acuity. Neck:  Supple; no masses or thyromegaly. Lungs:  Respirations even and unlabored.  Clear throughout to auscultation.   No wheezes, crackles, or rhonchi. No acute distress. Heart:  Regular rate and rhythm; no murmurs, clicks, rubs, or gallops. Abdomen:  Normal bowel sounds.  No bruits.  Soft, non-tender and non-distended without masses, hepatosplenomegaly or hernias noted.  No guarding or rebound tenderness.  Negative Carnett sign.   Rectal:  Deferred.  Pulses:  Normal pulses noted. Extremities:  No clubbing or edema.  No cyanosis. Neurologic:  Alert and oriented x3;  grossly normal neurologically. Skin:  Intact without significant lesions or rashes.  No jaundice. Lymph Nodes:  No significant cervical adenopathy. Psych:  Alert and cooperative. Normal mood and affect.  Imaging Studies: No results found.  Assessment and Plan:   Margaret [name] is a 60 y.o. y/o female who comes in today with a history of abnormal liver enzymes for some time.  The patient had a colonoscopy in 2014 by Dr. Gustavo Lah without any adenomas found.  The patient now presents with a chronic elevation of her liver enzymes.  The patient has had her alkaline phosphatase go up significantly over the last few years.  The patient will have her lab sent off for multiple possible causes of her abnormal liver enzymes including ruling out PBC.  She was negative for acute hepatitis A, B and C but the antibodies to document immunity to hepatitis a and B were not done and will be checked.  The patient will also have her blood sent off for a FirboSure to look for fibrosis.  The patient has been explained the plan and has been told that she will be contacted with the blood work results and that in the future if these do not show the cause of her abnormalities she may need a liver biopsy.    Lucilla Lame, MD. Marval Regal    Note: This dictation was prepared with Dragon dictation along with  smaller phrase technology. Any transcriptional errors that result from this process are unintentional.

## 2021-01-08 ENCOUNTER — Other Ambulatory Visit: Payer: Self-pay | Admitting: Psychiatry

## 2021-01-08 ENCOUNTER — Other Ambulatory Visit: Payer: Self-pay

## 2021-01-08 ENCOUNTER — Ambulatory Visit: Payer: BC Managed Care – PPO | Admitting: Gastroenterology

## 2021-01-08 ENCOUNTER — Encounter: Payer: Self-pay | Admitting: Gastroenterology

## 2021-01-08 ENCOUNTER — Ambulatory Visit
Admission: RE | Admit: 2021-01-08 | Discharge: 2021-01-08 | Disposition: A | Discharge status: Home / Self Care | Payer: BC Managed Care – PPO | Source: Ambulatory Visit | Attending: Internal Medicine | Admitting: Internal Medicine

## 2021-01-08 VITALS — BP 114/68 | HR 84 | Temp 98.6°F

## 2021-01-08 DIAGNOSIS — R748 Abnormal levels of other serum enzymes: Secondary | ICD-10-CM

## 2021-01-08 DIAGNOSIS — Z1231 Encounter for screening mammogram for malignant neoplasm of breast: Secondary | ICD-10-CM | POA: Insufficient documentation

## 2021-01-08 DIAGNOSIS — G4701 Insomnia due to medical condition: Secondary | ICD-10-CM

## 2021-01-10 ENCOUNTER — Other Ambulatory Visit: Payer: Self-pay

## 2021-01-10 ENCOUNTER — Telehealth: Payer: Self-pay | Admitting: Gastroenterology

## 2021-01-10 LAB — HCV FIBROSURE
ALPHA 2-MACROGLOBULINS, QN: 257 mg/dL (ref 110–276)
ALT (SGPT) P5P: 167 IU/L — ABNORMAL HIGH (ref 0–40)
Apolipoprotein A-1: 202 mg/dL (ref 116–209)
Bilirubin, Total: 0.5 mg/dL (ref 0.0–1.2)
Fibrosis Score: 0.42 — ABNORMAL HIGH (ref 0.00–0.21)
GGT: 1332 IU/L (ref 0–60)
Haptoglobin: 315 mg/dL (ref 33–346)
Necroinflammat Activity Score: 0.79 — ABNORMAL HIGH (ref 0.00–0.17)

## 2021-01-10 LAB — HEPATIC FUNCTION PANEL
ALT: 141 IU/L — ABNORMAL HIGH (ref 0–32)
AST: 85 IU/L — ABNORMAL HIGH (ref 0–40)
Albumin: 4.6 g/dL (ref 3.8–4.9)
Alkaline Phosphatase: 1067 IU/L (ref 44–121)
Bilirubin Total: 0.6 mg/dL (ref 0.0–1.2)
Bilirubin, Direct: 0.3 mg/dL (ref 0.00–0.40)
Total Protein: 8.1 g/dL (ref 6.0–8.5)

## 2021-01-10 LAB — ALPHA-1-ANTITRYPSIN: A-1 Antitrypsin: 183 mg/dL (ref 101–187)

## 2021-01-10 LAB — IRON AND TIBC
Iron Saturation: 26 % (ref 15–55)
Iron: 97 ug/dL (ref 27–159)
Total Iron Binding Capacity: 375 ug/dL (ref 250–450)
UIBC: 278 ug/dL (ref 131–425)

## 2021-01-10 LAB — CERULOPLASMIN: Ceruloplasmin: 41.4 mg/dL — ABNORMAL HIGH (ref 19.0–39.0)

## 2021-01-10 LAB — MITOCHONDRIAL ANTIBODIES: Mitochondrial Ab: 163 Units — ABNORMAL HIGH (ref 0.0–20.0)

## 2021-01-10 LAB — HEPATITIS A ANTIBODY, TOTAL: hep A Total Ab: NEGATIVE

## 2021-01-10 LAB — HEPATITIS B SURFACE ANTIBODY,QUALITATIVE: Hep B Surface Ab, Qual: NONREACTIVE

## 2021-01-10 LAB — ANTI-SMOOTH MUSCLE ANTIBODY, IGG: Smooth Muscle Ab: 6 Units (ref 0–19)

## 2021-01-10 LAB — FERRITIN: Ferritin: 313 ng/mL — ABNORMAL HIGH (ref 15–150)

## 2021-01-10 LAB — ANA: Anti Nuclear Antibody (ANA): NEGATIVE

## 2021-01-10 MED ORDER — URSODIOL 300 MG PO CAPS: 300.0000 mg | ORAL_CAPSULE | Freq: Three times a day (TID) | ORAL | 11 refills | Status: DC

## 2021-01-10 NOTE — Telephone Encounter (Signed)
Returned pt's call regarding recent lab results.

## 2021-01-10 NOTE — Telephone Encounter (Signed)
Pt. Requesting a call back did not state reason

## 2021-01-11 ENCOUNTER — Telehealth: Payer: Self-pay

## 2021-01-11 NOTE — Telephone Encounter (Signed)
-----   Message from Lucilla Lame, MD sent at 01/09/2021  7:40 AM EDT ----- Please let the patient know that she will need a hep A and B vaccine and her labs are still abnormal with some being slightly higher.  The other tests are still pending.

## 2021-01-11 NOTE — Telephone Encounter (Signed)
Pt notified of lab results

## 2021-01-18 ENCOUNTER — Other Ambulatory Visit: Payer: Self-pay | Admitting: Psychiatry

## 2021-01-18 DIAGNOSIS — F411 Generalized anxiety disorder: Secondary | ICD-10-CM

## 2021-02-08 ENCOUNTER — Other Ambulatory Visit: Payer: Self-pay

## 2021-02-08 ENCOUNTER — Telehealth: Payer: Self-pay

## 2021-02-08 DIAGNOSIS — R748 Abnormal levels of other serum enzymes: Secondary | ICD-10-CM

## 2021-02-08 NOTE — Telephone Encounter (Signed)
-----   Message from [place], [place] sent at 01/11/2021  8:21 AM EDT ----- Pt will need repeat LFT's. Pt aware.

## 2021-02-08 NOTE — Telephone Encounter (Signed)
Pt notified she is due to repeat her LFT's. Pt will go next week to [facility] outpatient lab to have done.

## 2021-02-11 ENCOUNTER — Other Ambulatory Visit
Admission: RE | Admit: 2021-02-11 | Discharge: 2021-02-11 | Disposition: A | Discharge status: Home / Self Care | Payer: BC Managed Care – PPO | Attending: Gastroenterology | Admitting: Gastroenterology

## 2021-02-11 DIAGNOSIS — R748 Abnormal levels of other serum enzymes: Secondary | ICD-10-CM | POA: Diagnosis present

## 2021-02-11 LAB — HEPATIC FUNCTION PANEL
ALT: 76 U/L — ABNORMAL HIGH (ref 0–44)
AST: 48 U/L — ABNORMAL HIGH (ref 15–41)
Albumin: 4 g/dL (ref 3.5–5.0)
Alkaline Phosphatase: 448 U/L — ABNORMAL HIGH (ref 38–126)
Bilirubin, Direct: 0.2 mg/dL (ref 0.0–0.2)
Indirect Bilirubin: 0.6 mg/dL (ref 0.3–0.9)
Total Bilirubin: 0.8 mg/dL (ref 0.3–1.2)
Total Protein: 8.6 g/dL — ABNORMAL HIGH (ref 6.5–8.1)

## 2021-02-13 ENCOUNTER — Telehealth: Payer: Self-pay

## 2021-02-13 NOTE — Telephone Encounter (Signed)
Pt notified of results

## 2021-02-13 NOTE — Telephone Encounter (Signed)
-----   Message from Lucilla Lame, MD sent at 02/13/2021  7:01 AM EST ----- Please let the patient know that her alkaline phosphatase has come down from over a thousand to 450.  Her other liver enzymes are also down.  She should repeat her labs in one month's time to see if they are further declining. If they are not then a liver biopsy may be needed to rule out other possible causes.

## 2021-03-22 DIAGNOSIS — K743 Primary biliary cirrhosis: Secondary | ICD-10-CM | POA: Insufficient documentation

## 2021-04-09 ENCOUNTER — Ambulatory Visit: Payer: BC Managed Care – PPO | Admitting: Psychiatry

## 2021-04-24 ENCOUNTER — Ambulatory Visit (INDEPENDENT_AMBULATORY_CARE_PROVIDER_SITE_OTHER): Payer: BC Managed Care – PPO | Admitting: Psychiatry

## 2021-04-24 ENCOUNTER — Encounter: Payer: Self-pay | Admitting: Psychiatry

## 2021-04-24 ENCOUNTER — Other Ambulatory Visit: Payer: Self-pay

## 2021-04-24 VITALS — BP 111/72 | HR 88 | Temp 97.0°F | Wt 130.2 lb

## 2021-04-24 DIAGNOSIS — F4010 Social phobia, unspecified: Secondary | ICD-10-CM

## 2021-04-24 DIAGNOSIS — G4701 Insomnia due to medical condition: Secondary | ICD-10-CM

## 2021-04-24 DIAGNOSIS — F411 Generalized anxiety disorder: Secondary | ICD-10-CM

## 2021-04-24 MED ORDER — FLUOXETINE HCL 20 MG PO CAPS: 20.0000 mg | ORAL_CAPSULE | Freq: Every day | ORAL | 0 refills | Status: DC

## 2021-04-24 MED ORDER — TRAZODONE HCL 50 MG PO TABS: ORAL_TABLET | ORAL | 0 refills | Status: DC

## 2021-04-24 NOTE — Progress Notes (Signed)
[facility] MD OP Progress Note  04/24/2021 5:33 PM Margaret [name]  MRN:  [id number]  Chief Complaint:  Chief Complaint  Patient presents with   Follow-up 61 year old Caucasian female female with history of generalized anxiety disorder, insomnia, social anxiety disorder, recent diagnosis of primary biliary cholangitis, currently on medications presented for medication management.   HPI: Margaret [name] is a 61 year old Caucasian female who is married, employed, lives in [place], has a history of GAD, social anxiety disorder, insomnia, was evaluated for medication management.  Patient's last appointment was on 12/12/2019.  Patient today returns  reporting that she had multiple psychosocial stressors going on, could not schedule a sooner appointment.  Patient reports she was diagnosed with primary biliary cholangitis and is currently on ursodiol, prescribed by gastroenterologist.  Patient with elevated liver enzymes currently reports good response to the medication.  Patient also reports her husband was also struggling with medical problems which also was anxiety provoking for her.  Patient reports she stayed on the Prozac and currently takes the 40 mg every other day or so.  Patient reports the current medications is keeping her anxiety symptoms stable.  Denies any panic attacks.  Reports social anxiety has improved.  Reports sleep is good as long as she takes her trazodone.  Denies suicidality, homicidality or perceptual disturbances.  Denies any other concerns today.  Visit Diagnosis:    ICD-10-CM   1. GAD (generalized anxiety disorder)  F41.1 FLUoxetine (PROZAC) 20 MG capsule    2. Insomnia due to medical condition  G47.01 traZODone (DESYREL) 50 MG tablet   anxiety    3. Social anxiety disorder  F40.10 FLUoxetine (PROZAC) 20 MG capsule      Past Psychiatric History: Reviewed past psychiatric history from progress note on 01/14/2018.  Past Medical History:  Past Medical History:  Diagnosis Date    Anxiety    Epilepsy ([place])    Hypothyroidism    Ovarian cyst     Past Surgical History:  Procedure Laterality Date   BREAST BIOPSY Left 2012   NEG   BREAST CYST ASPIRATION Left 2011   OVARY SURGERY      Family Psychiatric History: Reviewed family psychiatric history from progress note on 01/14/2018.  Family History:  Family History  Problem Relation Age of Onset   Diabetes Mother    CAD Father    Breast cancer Neg Hx     Social History: Reviewed social history from progress note on 01/14/2018. Social History   Socioeconomic History   Marital status: Married    Spouse name: Margaret [name]   Number of children: 2   Years of education: Not on file   Highest education level: High school graduate  Occupational History    Comment: part time  Tobacco Use   Smoking status: Former    Types: Cigarettes    Quit date: 03/06/2011    Years since quitting: 10.1   Smokeless tobacco: Never  Vaping Use   Vaping Use: Never used  Substance and Sexual Activity   Alcohol use: Yes    Alcohol/week: 8.0 standard drinks    Types: 4 Glasses of wine, 4 Shots of liquor per week   Drug use: No   Sexual activity: Yes    Birth control/protection: None  Other Topics Concern   Not on file  Social History Narrative   Not on file   Social Determinants of Health   Financial Resource Strain: Not on file  Food Insecurity: Not on file  Transportation Needs: Not on file  Physical Activity: Not on file  Stress: Not on file  Social Connections: Not on file    Allergies:  Allergies  Allergen Reactions   Keppra [Levetiracetam]    Lexapro [Escitalopram Oxalate] Nausea Only    Metabolic Disorder Labs: No results found for: HGBA1C, MPG No results found for: PROLACTIN No results found for: CHOL, TRIG, HDL, CHOLHDL, VLDL, LDLCALC Lab Results  Component Value Date   TSH 1.514 10/04/2016    Therapeutic Level Labs: No results found for: LITHIUM Lab Results  Component Value Date   VALPROATE 54  11/18/2017   VALPROATE 49 (L) 05/20/2017   No components found for:  CBMZ  Current Medications: Current Outpatient Medications  Medication Sig Dispense Refill   divalproex (DEPAKOTE) 250 MG DR tablet Take 2 tablets (500 mg total) by mouth daily. Prescribed by neurologist 60 tablet 0   FLUoxetine (PROZAC) 20 MG capsule Take 1 capsule (20 mg total) by mouth daily. 90 capsule 0   levothyroxine (SYNTHROID) 75 MCG tablet TAKE 1 TABLET(75 MCG) BY MOUTH EVERY DAY 30 TO 60 MINUTES BEFORE BREAKFAST ON AN EMPTY STOMACH AND WITH A GLASS OF WATER     loratadine (CLARITIN) 10 MG tablet Take by mouth.     Multiple Vitamin (MULTI-VITAMINS) TABS Take by mouth.     ursodiol (ACTIGALL) 300 MG capsule Take 1 capsule (300 mg total) by mouth 3 (three) times daily. With food 90 capsule 11   ursodiol (ACTIGALL) 300 MG capsule Take by mouth.     levothyroxine (SYNTHROID, LEVOTHROID) 75 MCG tablet  (Patient not taking: Reported on 04/24/2021)  5   traZODone (DESYREL) 50 MG tablet TAKE 1/2 TO 1 AND 1/2 TABLETS(25 TO 75 MG) BY MOUTH AT BEDTIME AS NEEDED FOR SLEEP 135 tablet 0   No current facility-administered medications for this visit.     Musculoskeletal: Strength & Muscle Tone: within normal limits Gait & Station: normal Patient leans: N/A  Psychiatric Specialty Exam: Review of Systems  Psychiatric/Behavioral:  The patient is nervous/anxious.   All other systems reviewed and are negative.  Blood pressure 111/72, pulse 88, temperature (!) 97 F (36.1 C), temperature source Temporal, weight 130 lb 3.2 oz (59.1 kg).Body mass index is 23.06 kg/m.  General Appearance: Casual  Eye Contact:  Fair  Speech:  Clear and Coherent  Volume:  Normal  Mood:  Anxious coping well  Affect:  Congruent  Thought Process:  Goal Directed and Descriptions of Associations: Intact  Orientation:  Full (Time, Place, and Person)  Thought Content: Logical   Suicidal Thoughts:  No  Homicidal Thoughts:  No  Memory:  Immediate;    Fair Recent;   Fair Remote;   Fair  Judgement:  Fair  Insight:  Fair  Psychomotor Activity:  Normal  Concentration:  Concentration: Fair and Attention Span: Fair  Recall:  [employer] of Knowledge: Fair  Language: Fair  Akathisia:  No  Handed:  Right  AIMS (if indicated): done,0  Assets:  Communication Skills Desire for Improvement Housing Social Support  ADL's:  Intact  Cognition: WNL  Sleep:  Fair   Screenings: GAD-7    [place] Office Visit from 04/24/2021 in [place]  Total GAD-7 Score 2      PHQ2-9    [place] Office Visit from 04/24/2021 in [place]  PHQ-2 Total Score 0        Assessment and Plan: DAJANA GEHRIG is a 61 year old Caucasian female who has a history of anxiety  disorder, seizure disorder, hypothyroidism and recent diagnosis of primary biliary cholangitis was evaluated in office today.  Patient was noncompliant with follow-up recommendations however today returns reporting mood symptoms are stable.  Discussed plan as noted below.  Plan GAD-stable We will reduce Prozac to 20 mg p.o. daily. Patient with recent diagnosis of primary biliary cholangitis, reviewed most recent labs-hepatic function panel-02/11/2021-AST/ALT elevated although downtrending. Patient is on medications like Depakote which can be hepatotoxic, prescribed by neurology.  Provided education.  Patient agrees to discuss this with neurologist.   Insomnia-stable Continue trazodone 25 to 75 mg p.o. nightly as needed  Social anxiety disorder-stable Continue Prozac as prescribed  Reviewed notes per Dr.Wohl dated 01/08/2021-patient had work-up done and was diagnosed with primary biliary cholangitis-she is on Actigall.   Consent: Patient/Guardian gives verbal consent for treatment and assignment of benefits for services provided during this visit. Patient/Guardian expressed understanding and agreed to proceed.    Follow-up in clinic in 3 months or sooner if needed.  This note was generated in part or whole with voice recognition software. Voice recognition is usually quite accurate but there are transcription errors that can and very often do occur. I apologize for any typographical errors that were not detected and corrected.      Ursula Alert, MD 04/24/2021, 5:33 PM

## 2021-05-23 ENCOUNTER — Telehealth: Payer: Self-pay

## 2021-05-23 NOTE — Telephone Encounter (Signed)
-----   Message from Glennie Isle, [place] sent at 05/08/2021 10:40 AM EST ----- ? ?----- Message ----- ?From: Glennie Isle, CMA ?Sent: 05/08/2021   9:00 AM EST ?To: Ginger Feldpausch, CMA ? ?Pt needs repeat LFT's.  ? ? ?

## 2021-05-23 NOTE — Telephone Encounter (Signed)
error 

## 2021-05-23 NOTE — Telephone Encounter (Signed)
Lab ordered for [facility] and pt stated she has labs for tomorrow for PCP and will have the lab drawn tomorrow ?

## 2021-05-27 NOTE — Addendum Note (Signed)
Addended by: Lurlean Nanny on: 05/27/2021 11:43 AM ? ? Modules accepted: Orders ? ?

## 2021-07-05 ENCOUNTER — Other Ambulatory Visit: Payer: Self-pay | Admitting: Psychiatry

## 2021-07-05 DIAGNOSIS — F4010 Social phobia, unspecified: Secondary | ICD-10-CM

## 2021-07-05 DIAGNOSIS — F411 Generalized anxiety disorder: Secondary | ICD-10-CM

## 2021-07-21 ENCOUNTER — Other Ambulatory Visit: Payer: Self-pay | Admitting: Psychiatry

## 2021-07-21 DIAGNOSIS — G4701 Insomnia due to medical condition: Secondary | ICD-10-CM

## 2021-07-23 ENCOUNTER — Ambulatory Visit: Payer: BC Managed Care – PPO | Admitting: Psychiatry

## 2021-08-08 ENCOUNTER — Ambulatory Visit: Payer: BC Managed Care – PPO | Admitting: Psychiatry

## 2021-08-13 ENCOUNTER — Ambulatory Visit: Payer: BC Managed Care – PPO | Admitting: Psychiatry

## 2021-09-12 ENCOUNTER — Encounter: Payer: Self-pay | Admitting: Psychiatry

## 2021-09-12 ENCOUNTER — Ambulatory Visit (INDEPENDENT_AMBULATORY_CARE_PROVIDER_SITE_OTHER): Payer: BC Managed Care – PPO | Admitting: Psychiatry

## 2021-09-12 VITALS — BP 112/77 | HR 78 | Ht 63.0 in | Wt 128.0 lb

## 2021-09-12 DIAGNOSIS — G4701 Insomnia due to medical condition: Secondary | ICD-10-CM | POA: Diagnosis not present

## 2021-09-12 DIAGNOSIS — F4010 Social phobia, unspecified: Secondary | ICD-10-CM

## 2021-09-12 DIAGNOSIS — F411 Generalized anxiety disorder: Secondary | ICD-10-CM

## 2021-09-12 MED ORDER — TRAZODONE HCL 50 MG PO TABS: 25.0000 mg | ORAL_TABLET | Freq: Every evening | ORAL | 1 refills | Status: DC | PRN

## 2021-09-12 MED ORDER — FLUOXETINE HCL 20 MG PO CAPS: ORAL_CAPSULE | ORAL | 1 refills | Status: DC

## 2021-09-12 MED ORDER — LORAZEPAM 0.5 MG PO TABS: 0.5000 mg | ORAL_TABLET | ORAL | 0 refills | Status: DC

## 2021-09-12 NOTE — Patient Instructions (Signed)
Lorazepam Tablets What is this medication? LORAZEPAM (lor A ze pam) treats anxiety. It works by Child psychotherapist system slow down. It belongs to a group of medications called benzodiazepines. This medicine may be used for other purposes; ask your health care provider or pharmacist if you have questions. COMMON BRAND NAME(S): Ativan What should I tell my care team before I take this medication? They need to know if you have any of these conditions: Glaucoma History of drug or alcohol abuse problem Kidney disease Liver disease Lung or breathing disease, like asthma Mental illness Myasthenia gravis Parkinson disease Suicidal thoughts, plans, or attempt; a previous suicide attempt by you or a family member An unusual or allergic reaction to lorazepam, other medications, foods, dyes, or preservatives Pregnant or trying to get pregnant Breast-feeding How should I use this medication? Take this medication by mouth with a glass of water. Follow the directions on the prescription label. Take your medication at regular intervals. Do not take it more often than directed. Do not stop taking except on your care team's advice. A special MedGuide will be given to you by the pharmacist with each prescription and refill. Be sure to read this information carefully each time. Talk to your care team regarding the use of this medication in children. While this medication may be used in children as young as 12 years for selected conditions, precautions do apply. Overdosage: If you think you have taken too much of this medicine contact a poison control center or emergency room at once. NOTE: This medicine is only for you. Do not share this medicine with others. What if I miss a dose? If you miss a dose, take it as soon as you can. If it is almost time for your next dose, take only that dose. Do not take double or extra doses. What may interact with this medication? Do not take this medication with any of  the following: Narcotic medications for cough Sodium oxybate This medication may also interact with the following: Alcohol Antihistamines for allergy, cough and cold Certain medications for anxiety or sleep Certain medications for depression, like amitriptyline, fluoxetine, sertraline Certain medications for seizures like carbamazepine, phenobarbital, phenytoin, primidone General anesthetics like lidocaine, pramoxine, tetracaine MAOIs like Carbex, Eldepryl, Marplan, Nardil, and Parnate Medications that relax muscles for surgery Narcotic medications for pain Phenothiazines like chlorpromazine, mesoridazine, prochlorperazine, thioridazine This list may not describe all possible interactions. Give your health care provider a list of all the medicines, herbs, non-prescription drugs, or dietary supplements you use. Also tell them if you smoke, drink alcohol, or use illegal drugs. Some items may interact with your medicine. What should I watch for while using this medication? Tell your care team if your symptoms do not start to get better or if they get worse. Do not stop taking except on your care team's advice. You may develop a severe reaction. Your care team will tell you how much medication to take. You may get drowsy or dizzy. Do not drive, use machinery, or do anything that needs mental alertness until you know how this medication affects you. To reduce the risk of dizzy and fainting spells, do not stand or sit up quickly, especially if you are an older patient. Alcohol may increase dizziness and drowsiness. Avoid alcoholic drinks. If you are taking another medication that also causes drowsiness, you may have more side effects. Give your care team a list of all medications you use. Your care team will tell you how much medication to  take. Do not take more medication than directed. Call emergency service for help if you have problems breathing or unusual sleepiness. What side effects may I notice  from receiving this medication? Side effects that you should report to your care team as soon as possible: Allergic reactions--skin rash, itching, hives, swelling of the face, lips, tongue, or throat CNS depression--slow or shallow breathing, shortness of breath, feeling faint, dizziness, confusion, difficulty staying awake Thoughts of suicide or self-harm, worsening mood, feelings of depression Side effects that usually do not require medical attention (report to your care team if they continue or are bothersome): Dizziness Drowsiness Headache Nausea Vomiting This list may not describe all possible side effects. Call your doctor for medical advice about side effects. You may report side effects to FDA at 1-800-FDA-1088. Where should I keep my medication? Keep out of the reach of children. This medication can be abused. Keep your medication in a safe place to protect it from theft. Do not share this medication with anyone. Selling or giving away this medication is dangerous and against the law. Store at room temperature between 20 and 25 degrees C (68 and 77 degrees F). Protect from light. Keep container tightly closed. This medication may cause accidental overdose and death if taken by other adults, children, or pets. Mix any unused medication with a substance like cat litter or coffee grounds. Then throw the medication away in a sealed container like a sealed bag or a coffee can with a lid. Do not use the medication after the expiration date. NOTE: This sheet is a summary. It may not cover all possible information. If you have questions about this medicine, talk to your doctor, pharmacist, or health care provider.  2023 Elsevier/Gold Standard (2020-02-23 00:00:00)

## 2021-09-12 NOTE — Progress Notes (Signed)
[facility] MD OP Progress Note  09/12/2021 1:39 PM Margaret [name]  MRN:  [id number]  Chief Complaint:  Chief Complaint  Patient presents with   Follow-up: 61 year old Caucasian female with history of anxiety, presented for medication management.   HPI: Margaret [name] is a 61 year old Caucasian female who is married, employed, lives in [place], has a history of GAD, social anxiety disorder, insomnia was evaluated for medication management.  Patient today reports she is currently doing fairly well on the current medication regimen.  Denies any significant anxiety or depressive symptoms.  Patient reports sleep as good.  Takes the trazodone and that helps.  Patient reports she is compliant on the Prozac.  Denies side effects.  She reports she is awaiting a dental procedure and would like to have an antianxiety agent prior to the procedure to help her to relax.  Reports she looks forward to a trip that she is going to take to the [facility] in August.  Her daughter is also getting married and she looks forward to that.  Reports neurologist reduced her Depakote 125 mg every other day.  She reports she has upcoming appointment with her provider who manages her liver function.  Patient denies any other concerns today.  Visit Diagnosis:    ICD-10-CM   1. GAD (generalized anxiety disorder)  F41.1 LORazepam (ATIVAN) 0.5 MG tablet    FLUoxetine (PROZAC) 20 MG capsule    2. Insomnia due to medical condition  G47.01 traZODone (DESYREL) 50 MG tablet   anxiety    3. Social anxiety disorder  F40.10 FLUoxetine (PROZAC) 20 MG capsule      Past Psychiatric History: Reviewed past psychiatric history from progress note on 01/14/2018.  Past Medical History:  Past Medical History:  Diagnosis Date   Anxiety    Epilepsy ([place])    Hypothyroidism    Ovarian cyst     Past Surgical History:  Procedure Laterality Date   BREAST BIOPSY Left 2012   NEG   BREAST CYST ASPIRATION Left 2011   OVARY SURGERY       Family Psychiatric History: Reviewed family psychiatric history from progress note on 01/14/2018.  Family History:  Family History  Problem Relation Age of Onset   Diabetes Mother    CAD Father    Breast cancer Neg Hx     Social History: Reviewed social history from progress note on 01/14/2018. Social History   Socioeconomic History   Marital status: Married    Spouse name: hubert   Number of children: 2   Years of education: Not on file   Highest education level: High school graduate  Occupational History    Comment: part time  Tobacco Use   Smoking status: Former    Types: Cigarettes    Quit date: 03/06/2011    Years since quitting: 10.5   Smokeless tobacco: Never  Vaping Use   Vaping Use: Never used  Substance and Sexual Activity   Alcohol use: Yes    Alcohol/week: 8.0 standard drinks of alcohol    Types: 4 Glasses of wine, 4 Shots of liquor per week   Drug use: No   Sexual activity: Yes    Birth control/protection: None  Other Topics Concern   Not on file  Social History Narrative   Not on file   Social Determinants of Health   Financial Resource Strain: Low Risk  (03/05/2017)   Overall Financial Resource Strain (CARDIA)    Difficulty of Paying Living Expenses: Not hard at all  Food Insecurity: No Food Insecurity (03/05/2017)   Hunger Vital Sign    Worried About Running Out of Food in the Last Year: Never true    Ran Out of Food in the Last Year: Never true  Transportation Needs: No Transportation Needs (03/05/2017)   PRAPARE - Hydrologist (Medical): No    Lack of Transportation (Non-Medical): No  Physical Activity: Sufficiently Active (03/05/2017)   Exercise Vital Sign    Days of Exercise per Week: 7 days    Minutes of Exercise per Session: 30 min  Stress: No Stress Concern Present (03/05/2017)   [place]    Feeling of Stress : Not at all  Social  Connections: Moderately Isolated (03/05/2017)   Social Connection and Isolation Panel [NHANES]    Frequency of Communication with Friends and Family: Never    Frequency of Social Gatherings with Friends and Family: Never    Attends Religious Services: Never    Marine scientist or Organizations: No    Attends Archivist Meetings: Never    Marital Status: Married    Allergies:  Allergies  Allergen Reactions   Keppra [Levetiracetam]    Lexapro [Escitalopram Oxalate] Nausea Only    Metabolic Disorder Labs: No results found for: "HGBA1C", "MPG" No results found for: "PROLACTIN" No results found for: "CHOL", "TRIG", "HDL", "CHOLHDL", "VLDL", "LDLCALC" Lab Results  Component Value Date   TSH 1.514 10/04/2016    Therapeutic Level Labs: No results found for: "LITHIUM" Lab Results  Component Value Date   VALPROATE 54 11/18/2017   VALPROATE 49 (L) 05/20/2017   No results found for: "CBMZ"  Current Medications: Current Outpatient Medications  Medication Sig Dispense Refill   divalproex (DEPAKOTE) 250 MG DR tablet Take 2 tablets (500 mg total) by mouth daily. Prescribed by neurologist (Patient taking differently: Take 500 mg by mouth as directed. TAKES DIFFERENTLY - 125 MG EVERY OTHER DAY) 60 tablet 0   levothyroxine (SYNTHROID) 75 MCG tablet TAKE 1 TABLET(75 MCG) BY MOUTH EVERY DAY 30 TO 60 MINUTES BEFORE BREAKFAST ON AN EMPTY STOMACH AND WITH A GLASS OF WATER     loratadine (CLARITIN) 10 MG tablet Take by mouth.     LORazepam (ATIVAN) 0.5 MG tablet Take 1-2 tablets (0.5-1 mg total) by mouth as directed. Take 1-2 tablets as needed 20 minutes prior to your dental procedure for anxiety 3 tablet 0   Multiple Vitamin (MULTI-VITAMINS) TABS Take by mouth.     ursodiol (ACTIGALL) 300 MG capsule Take 1 capsule (300 mg total) by mouth 3 (three) times daily. With food 90 capsule 11   FLUoxetine (PROZAC) 20 MG capsule TAKE 1 CAPSULE(20 MG) BY MOUTH DAILY 90 capsule 1   traZODone  (DESYREL) 50 MG tablet Take 0.5-1 tablets (25-50 mg total) by mouth at bedtime as needed for sleep. 90 tablet 1   No current facility-administered medications for this visit.     Musculoskeletal: Strength & Muscle Tone:  WNL Gait & Station: normal Patient leans: N/A  Psychiatric Specialty Exam: Review of Systems  Psychiatric/Behavioral: Negative.    All other systems reviewed and are negative.   Blood pressure 112/77, pulse 78, height '5\' 3"'$  (1.6 m), weight 128 lb (58.1 kg), SpO2 98 %.Body mass index is 22.67 kg/m.  General Appearance: Casual  Eye Contact:  Fair  Speech:  Normal Rate  Volume:  Normal  Mood:  Euthymic  Affect:  Congruent  Thought Process:  Goal Directed and Descriptions of Associations: Intact  Orientation:  Full (Time, Place, and Person)  Thought Content: Logical   Suicidal Thoughts:  No  Homicidal Thoughts:  No  Memory:  Immediate;   Fair Recent;   Fair Remote;   Fair  Judgement:  Fair  Insight:  Fair  Psychomotor Activity:  Normal  Concentration:  Concentration: Fair and Attention Span: Fair  Recall:  [employer] of Knowledge: Fair  Language: Fair  Akathisia:  No  Handed:  Right  AIMS (if indicated): done  Assets:  Communication Skills Desire for Improvement Housing Social Support  ADL's:  Intact  Cognition: WNL  Sleep:  Fair   Screenings: GAD-7    [place] Office Visit from 04/24/2021 in [place]  Total GAD-7 Score 2      PHQ2-9    [facility] Office Visit from 09/12/2021 in [place] Office Visit from 04/24/2021 in [place]  PHQ-2 Total Score 0 0      [place] Office Visit from 09/12/2021 in [place] No Risk        Assessment and Plan: Margaret [name] is a 61 year old Caucasian female who has a history of anxiety disorder, seizure disorder, hypothyroidism, primary biliary  cholangitis was evaluated in office today.  Patient is currently stable.  Plan as noted below.  Plan  GAD-stable Prozac 20 mg p.o. daily-reduced dosage. Start lorazepam-0.5 mg take 1 to 2 tablets prior to her dental procedure.  Reviewed [place] PMP aware.  Provided medication education.  Patient also to have a discussion with her dentist prior to taking this medication prior to the procedure.  Insomnia-stable Will reduce trazodone to 25 to 50 mg p.o. nightly as needed  Social anxiety disorder-stable Continue Prozac as prescribed  Reviewed notes per Dr. Melrose Nakayama dated 07/03/2021-patient at that visit was advised to take Depakote 125 mg once every other day for 1 month and then stop taking it.  If desired.  "  Follow-up in clinic in 4 to 5 months or sooner if needed.  This note was generated in part or whole with voice recognition software. Voice recognition is usually quite accurate but there are transcription errors that can and very often do occur. I apologize for any typographical errors that were not detected and corrected.      Ursula Alert, MD 09/13/2021, 8:20 AM

## 2021-11-26 ENCOUNTER — Other Ambulatory Visit: Payer: Self-pay | Admitting: Internal Medicine

## 2021-11-26 DIAGNOSIS — Z1231 Encounter for screening mammogram for malignant neoplasm of breast: Secondary | ICD-10-CM

## 2021-12-02 ENCOUNTER — Other Ambulatory Visit: Payer: Self-pay

## 2021-12-02 MED ORDER — URSODIOL 300 MG PO CAPS
300.0000 mg | ORAL_CAPSULE | Freq: Three times a day (TID) | ORAL | 4 refills | Status: DC
Start: 2021-12-02 — End: 2022-05-29

## 2021-12-13 DIAGNOSIS — E039 Hypothyroidism, unspecified: Secondary | ICD-10-CM | POA: Diagnosis not present

## 2021-12-13 DIAGNOSIS — E785 Hyperlipidemia, unspecified: Secondary | ICD-10-CM | POA: Diagnosis not present

## 2022-01-01 ENCOUNTER — Other Ambulatory Visit: Payer: Self-pay | Admitting: Gastroenterology

## 2022-01-06 DIAGNOSIS — D225 Melanocytic nevi of trunk: Secondary | ICD-10-CM | POA: Diagnosis not present

## 2022-01-06 DIAGNOSIS — D2272 Melanocytic nevi of left lower limb, including hip: Secondary | ICD-10-CM | POA: Diagnosis not present

## 2022-01-06 DIAGNOSIS — D2262 Melanocytic nevi of left upper limb, including shoulder: Secondary | ICD-10-CM | POA: Diagnosis not present

## 2022-01-06 DIAGNOSIS — D2261 Melanocytic nevi of right upper limb, including shoulder: Secondary | ICD-10-CM | POA: Diagnosis not present

## 2022-01-07 DIAGNOSIS — Z23 Encounter for immunization: Secondary | ICD-10-CM | POA: Diagnosis not present

## 2022-01-07 DIAGNOSIS — E039 Hypothyroidism, unspecified: Secondary | ICD-10-CM | POA: Diagnosis not present

## 2022-01-07 DIAGNOSIS — K743 Primary biliary cirrhosis: Secondary | ICD-10-CM | POA: Diagnosis not present

## 2022-01-07 DIAGNOSIS — F411 Generalized anxiety disorder: Secondary | ICD-10-CM | POA: Diagnosis not present

## 2022-01-09 ENCOUNTER — Ambulatory Visit
Admission: RE | Admit: 2022-01-09 | Discharge: 2022-01-09 | Disposition: A | Discharge status: Home / Self Care | Payer: BC Managed Care – PPO | Source: Ambulatory Visit | Attending: Internal Medicine | Admitting: Internal Medicine

## 2022-01-09 DIAGNOSIS — Z1231 Encounter for screening mammogram for malignant neoplasm of breast: Secondary | ICD-10-CM | POA: Insufficient documentation

## 2022-02-04 ENCOUNTER — Encounter: Payer: Self-pay | Admitting: Psychiatry

## 2022-02-04 ENCOUNTER — Ambulatory Visit: Payer: BC Managed Care – PPO | Admitting: Psychiatry

## 2022-02-04 VITALS — BP 126/81 | HR 80 | Temp 97.8°F | Ht 63.0 in | Wt 133.4 lb

## 2022-02-04 DIAGNOSIS — F4010 Social phobia, unspecified: Secondary | ICD-10-CM

## 2022-02-04 DIAGNOSIS — G4701 Insomnia due to medical condition: Secondary | ICD-10-CM | POA: Diagnosis not present

## 2022-02-04 DIAGNOSIS — F411 Generalized anxiety disorder: Secondary | ICD-10-CM | POA: Diagnosis not present

## 2022-02-04 NOTE — Progress Notes (Signed)
[facility] MD OP Progress Note  02/04/2022 1:20 PM Margaret [name]  MRN:  [id number]  Chief Complaint:  Chief Complaint  Patient presents with   Follow-up   Anxiety   Depression   Medication Refill   HPI: Margaret [name] is a 61 year old Caucasian female who is married, employed, lives in [place], has a history of GAD, social anxiety disorder, insomnia was evaluated for medication management.  Patient today reports she is currently doing well on the current medication regimen.  Her spouse had hernia surgery recently and at that time she was pretty anxious.  Otherwise she has been doing fairly well.  Her husband is currently recovering.  Patient denies any sadness, hopelessness or anhedonia.  Reports appetite is fair.  Reports sleep is good.  Currently compliant on the Prozac.  Denies side effects.  Does use the trazodone at night for sleep.  Denies side effects.  Patient reports she looks forward to her daughter's wedding which is coming up in February.  Patient reports she had a good Thanksgiving with her family.  Denies any suicidality, homicidality or perceptual disturbances.  Patient denies any other concerns today.  Visit Diagnosis:    ICD-10-CM   1. GAD (generalized anxiety disorder)  F41.1     2. Insomnia due to medical condition  G47.01    Anxiety    3. Social anxiety disorder  F40.10       Past Psychiatric History: Reviewed past psychiatric history from progress note on 01/14/2018.  Past Medical History:  Past Medical History:  Diagnosis Date   Anxiety    Epilepsy ([place])    Hypothyroidism    Ovarian cyst     Past Surgical History:  Procedure Laterality Date   BREAST BIOPSY Left 2012   NEG   BREAST CYST ASPIRATION Left 2011   OVARY SURGERY      Family Psychiatric History: Reviewed family psychiatric history from progress note on 01/14/2018.  Family History:  Family History  Problem Relation Age of Onset   Diabetes Mother    CAD Father    Breast cancer Neg Hx      Social History: Reviewed social history from progress note on 01/24/2018. Social History   Socioeconomic History   Marital status: Married    Spouse name: hubert   Number of children: 2   Years of education: Not on file   Highest education level: High school graduate  Occupational History    Comment: part time  Tobacco Use   Smoking status: Former    Types: Cigarettes    Quit date: 03/06/2011    Years since quitting: 10.9   Smokeless tobacco: Never  Vaping Use   Vaping Use: Never used  Substance and Sexual Activity   Alcohol use: Yes    Alcohol/week: 8.0 standard drinks of alcohol    Types: 4 Glasses of wine, 4 Shots of liquor per week   Drug use: No   Sexual activity: Yes    Birth control/protection: None  Other Topics Concern   Not on file  Social History Narrative   Not on file   Social Determinants of Health   Financial Resource Strain: Low Risk  (03/05/2017)   Overall Financial Resource Strain (CARDIA)    Difficulty of Paying Living Expenses: Not hard at all  Food Insecurity: No Food Insecurity (03/05/2017)   Hunger Vital Sign    Worried About Running Out of Food in the Last Year: Never true    Ran Out of Food in the  Last Year: Never true  Transportation Needs: No Transportation Needs (03/05/2017)   PRAPARE - Hydrologist (Medical): No    Lack of Transportation (Non-Medical): No  Physical Activity: Sufficiently Active (03/05/2017)   Exercise Vital Sign    Days of Exercise per Week: 7 days    Minutes of Exercise per Session: 30 min  Stress: No Stress Concern Present (03/05/2017)   [place]    Feeling of Stress : Not at all  Social Connections: Moderately Isolated (03/05/2017)   Social Connection and Isolation Panel [NHANES]    Frequency of Communication with Friends and Family: Never    Frequency of Social Gatherings with Friends and Family: Never     Attends Religious Services: Never    Marine scientist or Organizations: No    Attends Archivist Meetings: Never    Marital Status: Married    Allergies:  Allergies  Allergen Reactions   Keppra [Levetiracetam]    Lexapro [Escitalopram Oxalate] Nausea Only    Metabolic Disorder Labs: No results found for: "HGBA1C", "MPG" No results found for: "PROLACTIN" No results found for: "CHOL", "TRIG", "HDL", "CHOLHDL", "VLDL", "LDLCALC" Lab Results  Component Value Date   TSH 1.514 10/04/2016    Therapeutic Level Labs: No results found for: "LITHIUM" Lab Results  Component Value Date   VALPROATE 54 11/18/2017   VALPROATE 49 (L) 05/20/2017   No results found for: "CBMZ"  Current Medications: Current Outpatient Medications  Medication Sig Dispense Refill   divalproex (DEPAKOTE) 250 MG DR tablet Take 2 tablets (500 mg total) by mouth daily. Prescribed by neurologist (Patient taking differently: Take 125 mg by mouth as directed. TAKES DIFFERENTLY - 125 MG EVERY OTHER DAY) 60 tablet 0   FLUoxetine (PROZAC) 20 MG capsule TAKE 1 CAPSULE(20 MG) BY MOUTH DAILY 90 capsule 1   levothyroxine (SYNTHROID) 88 MCG tablet TAKE 1 TABLET(88 MCG) BY MOUTH EVERY DAY 30 TO 60 MINUTES BEFORE BREAKFAST ON AN EMPTY STOMACH AND WITH A GLASS OF WATER     loratadine (CLARITIN) 10 MG tablet Take by mouth.     Multiple Vitamin (MULTI-VITAMINS) TABS Take by mouth.     traZODone (DESYREL) 50 MG tablet Take 0.5-1 tablets (25-50 mg total) by mouth at bedtime as needed for sleep. 90 tablet 1   ursodiol (ACTIGALL) 300 MG capsule Take 1 capsule (300 mg total) by mouth 3 (three) times daily. With food 90 capsule 4   LORazepam (ATIVAN) 0.5 MG tablet Take 1-2 tablets (0.5-1 mg total) by mouth as directed. Take 1-2 tablets as needed 20 minutes prior to your dental procedure for anxiety (Patient not taking: Reported on 02/04/2022) 3 tablet 0   No current facility-administered medications for this visit.      Musculoskeletal: Strength & Muscle Tone: within normal limits Gait & Station: normal Patient leans: N/A  Psychiatric Specialty Exam: Review of Systems  Psychiatric/Behavioral: Negative.    All other systems reviewed and are negative.   Blood pressure 126/81, pulse 80, temperature 97.8 F (36.6 C), temperature source Oral, height '5\' 3"'$  (1.6 m), weight 133 lb 6.4 oz (60.5 kg).Body mass index is 23.63 kg/m.  General Appearance: Casual  Eye Contact:  Good  Speech:  Clear and Coherent  Volume:  Normal  Mood:  Euthymic  Affect:  Congruent  Thought Process:  Goal Directed and Descriptions of Associations: Intact  Orientation:  Full (Time, Place, and Person)  Thought Content: Logical  Suicidal Thoughts:  No  Homicidal Thoughts:  No  Memory:  Immediate;   Fair Recent;   Fair Remote;   Fair  Judgement:  Fair  Insight:  Fair  Psychomotor Activity:  Normal  Concentration:  Concentration: Fair and Attention Span: Fair  Recall:  [employer] of Knowledge: Fair  Language: Fair  Akathisia:  No  Handed:  Right  AIMS (if indicated): not done  Assets:  Communication Skills Desire for Improvement Housing Social Support Talents/Skills  ADL's:  Intact  Cognition: WNL  Sleep:  Fair   Screenings: GAD-7    [place] Office Visit from 02/04/2022 in [facility] Office Visit from 04/24/2021 in [facility]  Total GAD-7 Score 1 2      PHQ2-9    [place] Visit from 02/04/2022 in [facility] Visit from 09/12/2021 in [place] [facility] Visit from 04/24/2021 in [facility]  PHQ-2 Total Score 0 0 0  PHQ-9 Total Score 0 -- --      [place] Office Visit from 02/04/2022 in [facility] Office Visit from 09/12/2021 in [facility] No Risk No  Risk        Assessment and Plan: SYEDA PRICKETT is a 61 year old Caucasian female who has a history of anxiety disorder, seizure disorder, hypothyroidism, primary biliary cholangitis was evaluated in office today.  Patient is currently stable.  Plan as noted below.  Plan GAD-stable Prozac 20 mg p.o. daily-reduced dosage. Lorazepam 0.5 mg take 1 to 2 tablets prior to dental procedure.   Insomnia-stable Trazodone 25-50 mg p.o. nightly as needed  Social anxiety disorder-stable Continue Prozac as prescribed  Reviewed and discussed labs including CMP-AST-31 within normal limits, ALT-elevated at 40, alkaline phosphatase-elevated at 309-improving. TSH-12/13/2021-within normal limits.  Follow-up in clinic in 6 months or sooner if needed.  This note was generated in part or whole with voice recognition software. Voice recognition is usually quite accurate but there are transcription errors that can and very often do occur. I apologize for any typographical errors that were not detected and corrected.     Ursula Alert, MD 02/04/2022, 1:20 PM

## 2022-04-08 ENCOUNTER — Ambulatory Visit: Payer: BC Managed Care – PPO | Admitting: Gastroenterology

## 2022-05-29 ENCOUNTER — Ambulatory Visit: Payer: BC Managed Care – PPO | Admitting: Gastroenterology

## 2022-05-29 ENCOUNTER — Encounter: Payer: Self-pay | Admitting: Gastroenterology

## 2022-05-29 VITALS — BP 128/77 | HR 73 | Temp 98.7°F | Ht 62.0 in | Wt 136.4 lb

## 2022-05-29 DIAGNOSIS — K743 Primary biliary cirrhosis: Secondary | ICD-10-CM | POA: Diagnosis not present

## 2022-05-29 DIAGNOSIS — R748 Abnormal levels of other serum enzymes: Secondary | ICD-10-CM

## 2022-05-29 DIAGNOSIS — Z1211 Encounter for screening for malignant neoplasm of colon: Secondary | ICD-10-CM | POA: Diagnosis not present

## 2022-05-29 MED ORDER — OBETICHOLIC ACID 5 MG PO TABS: 1.0000 | ORAL_TABLET | Freq: Every day | ORAL | 3 refills | Status: DC

## 2022-05-29 MED ORDER — NA SULFATE-K SULFATE-MG SULF 17.5-3.13-1.6 GM/177ML PO SOLN: 1.0000 | Freq: Once | ORAL | 0 refills | Status: AC

## 2022-05-29 MED ORDER — URSODIOL 300 MG PO CAPS: 300.0000 mg | ORAL_CAPSULE | Freq: Three times a day (TID) | ORAL | 11 refills | Status: DC

## 2022-05-29 NOTE — Progress Notes (Signed)
Primary Care Physician: Baxter Hire, MD  Primary Gastroenterologist:  Dr. Lucilla Lame  Chief Complaint  Patient presents with   Follow-up    Pt stated it is time for colonoscopy--10 yr-request to have with   Medication Refill    HPI: Margaret [name] is a 62 y.o. female here with a history of PBC.  The patient comes in for refill of her Actigall.  The patient's alkaline phosphatase has come down dramatically from over thousand to over 300.  Her liver enzymes have also come down but are not back to normal.  The patient states that she has been doing well without any complaints.  He is presently taking 300 mg 3 times a day.  Past Medical History:  Diagnosis Date   Anxiety    Epilepsy ([place])    Hypothyroidism    Ovarian cyst     Current Outpatient Medications  Medication Sig Dispense Refill   divalproex (DEPAKOTE) 250 MG DR tablet Take 2 tablets (500 mg total) by mouth daily. Prescribed by neurologist (Patient taking differently: Take 125 mg by mouth as directed. TAKES DIFFERENTLY - 125 MG EVERY OTHER DAY) 60 tablet 0   FLUoxetine (PROZAC) 20 MG capsule TAKE 1 CAPSULE(20 MG) BY MOUTH DAILY 90 capsule 1   levothyroxine (SYNTHROID) 88 MCG tablet TAKE 1 TABLET(88 MCG) BY MOUTH EVERY DAY 30 TO 60 MINUTES BEFORE BREAKFAST ON AN EMPTY STOMACH AND WITH A GLASS OF WATER     loratadine (CLARITIN) 10 MG tablet Take by mouth.     LORazepam (ATIVAN) 0.5 MG tablet Take 1-2 tablets (0.5-1 mg total) by mouth as directed. Take 1-2 tablets as needed 20 minutes prior to your dental procedure for anxiety 3 tablet 0   Multiple Vitamin (MULTI-VITAMINS) TABS Take by mouth.     traZODone (DESYREL) 50 MG tablet Take 0.5-1 tablets (25-50 mg total) by mouth at bedtime as needed for sleep. 90 tablet 1   ursodiol (ACTIGALL) 300 MG capsule Take 1 capsule (300 mg total) by mouth 3 (three) times daily. With food 90 capsule 11   No current facility-administered medications for this visit.    Allergies as of  05/29/2022 - Review Complete 05/29/2022  Allergen Reaction Noted   Keppra [levetiracetam]  11/18/2017   Lexapro [escitalopram oxalate] Nausea Only 05/19/2017    ROS:  General: Negative for anorexia, weight loss, fever, chills, fatigue, weakness. ENT: Negative for hoarseness, difficulty swallowing , nasal congestion. CV: Negative for chest pain, angina, palpitations, dyspnea on exertion, peripheral edema.  Respiratory: Negative for dyspnea at rest, dyspnea on exertion, cough, sputum, wheezing.  GI: See history of present illness. GU:  Negative for dysuria, hematuria, urinary incontinence, urinary frequency, nocturnal urination.  Endo: Negative for unusual weight change.    Physical Examination:   BP 128/77 (BP Location: Right Arm, Patient Position: Sitting, Cuff Size: Normal)   Pulse 73   Temp 98.7 F (37.1 C) (Oral)   Ht 5\' 2"  (1.575 m)   Wt 136 lb 6.4 oz (61.9 kg)   BMI 24.95 kg/m   General: Well-nourished, well-developed in no acute distress.  Eyes: No icterus. Conjunctivae pink. Neuro: Alert and oriented x 3.  Grossly intact. Skin: Warm and dry, no jaundice.   Psych: Alert and cooperative, normal mood and affect.  Labs:    Imaging Studies: No results found.  Assessment and Plan:   Margaret [name] is a 62 y.o. y/o female comes in today with a history of PBC.  The patient has  not phosphatase return to normal.  The patient also is in need of screening colonoscopy.  The patient will be set up for a screening colonoscopy and will have obeticholic acid added at 5mg  a day.  Her labs checked again in 3 months and if not back to normal she may need to go up to 10 mg a day.  The patient has been explained the plan and agrees with it.     Lucilla Lame, MD. Marval Regal    Note: This dictation was prepared with Dragon dictation along with smaller phrase technology. Any transcriptional errors that result from this process are unintentional.

## 2022-06-02 ENCOUNTER — Telehealth: Payer: Self-pay

## 2022-06-02 ENCOUNTER — Ambulatory Visit: Payer: BC Managed Care – PPO | Admitting: Gastroenterology

## 2022-06-02 NOTE — Telephone Encounter (Signed)
Patient left a voicemail stating she was told to call back about a speciality medication when she was seen on 05/29/2022. She states she is really confused.

## 2022-06-02 NOTE — Telephone Encounter (Signed)
I spoke to pt and she reports that [facility] had to send Rx to [facility] mail order as it is considered a specialty drug... Pt advised to let me know if cost is too much

## 2022-06-26 ENCOUNTER — Encounter: Payer: Self-pay | Admitting: Gastroenterology

## 2022-06-29 IMAGING — MG DIGITAL SCREENING BILAT W/ TOMO W/ CAD
6 of 10 series · 6 of 30 positions shown · non-contrast
Comparison: Previous exam(s).

CLINICAL DATA: Screening.

EXAM:
DIGITAL SCREENING BILATERAL MAMMOGRAM WITH TOMO AND CAD

[L XCCL synth-2D]
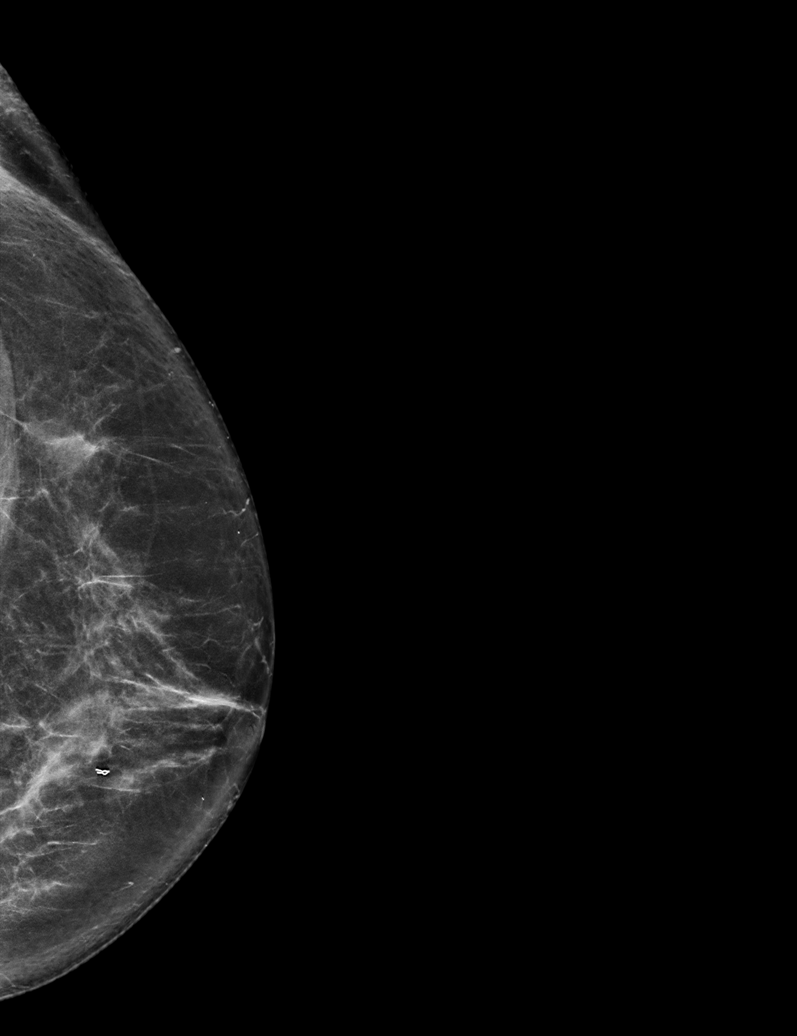

[R CC synth-2D]
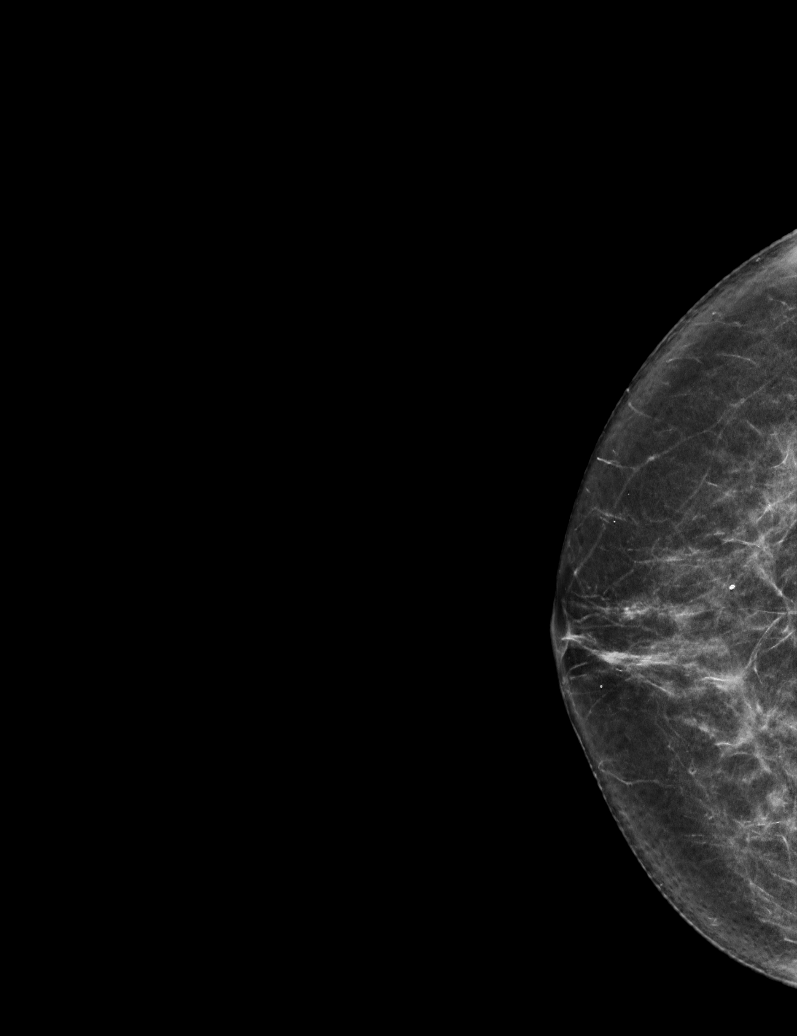

[R MLO synth-2D]
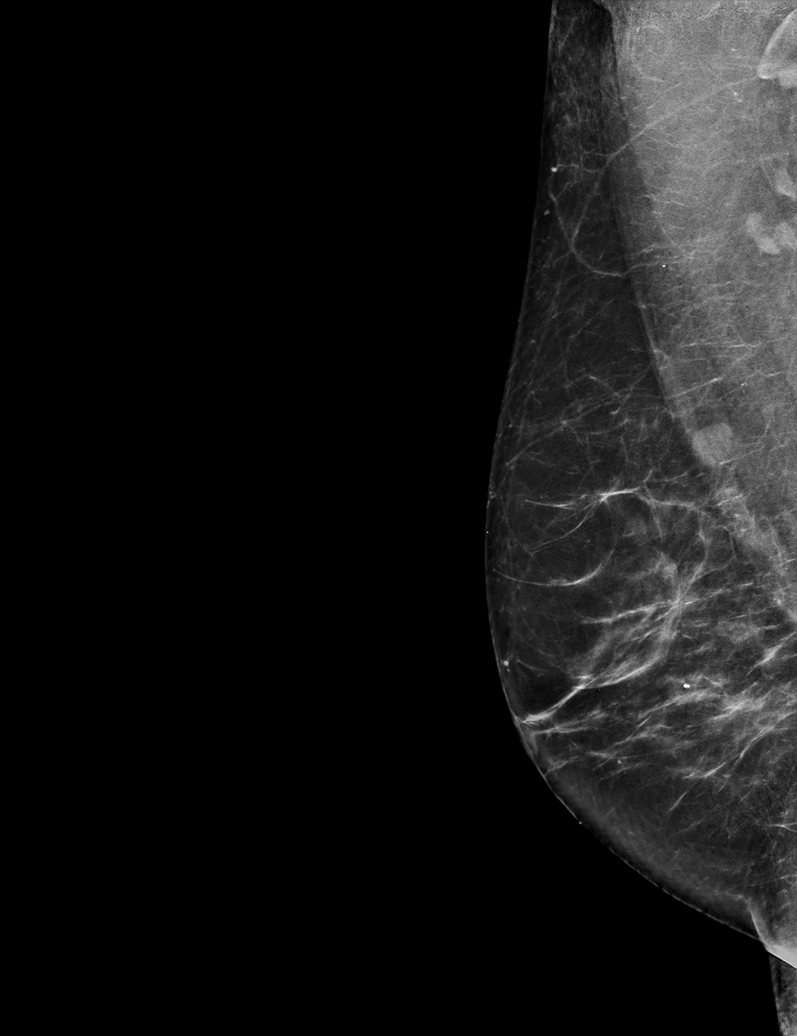

[L MLO synth-2D]
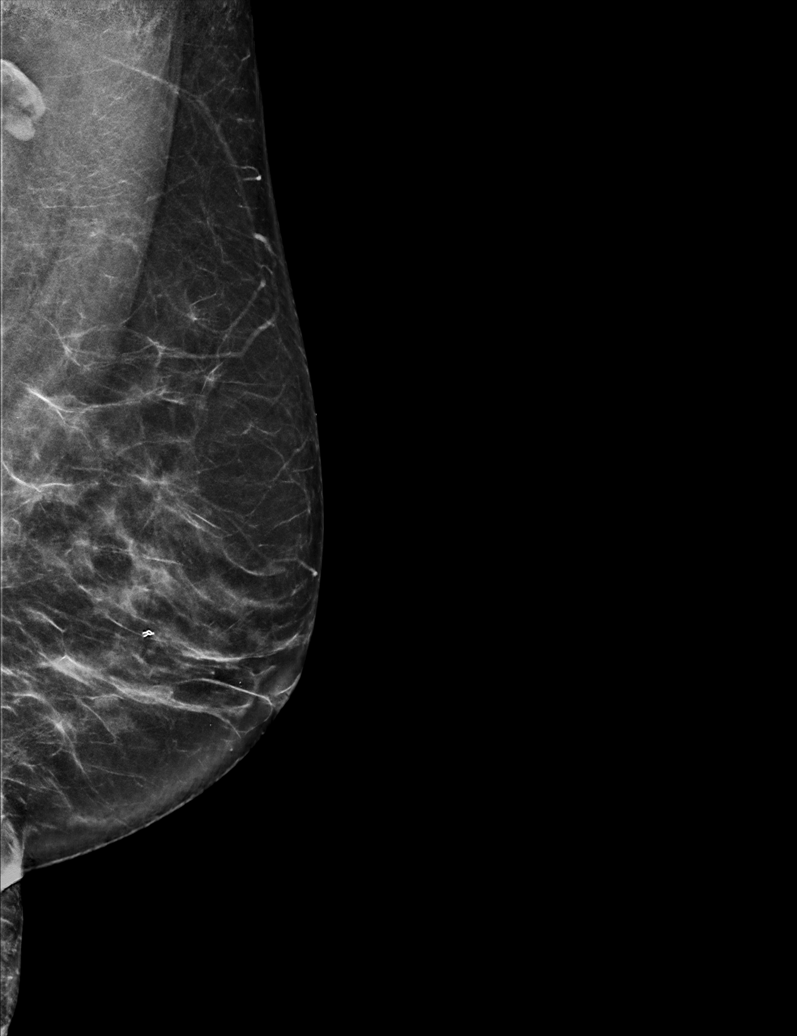

[L CC synth-2D]
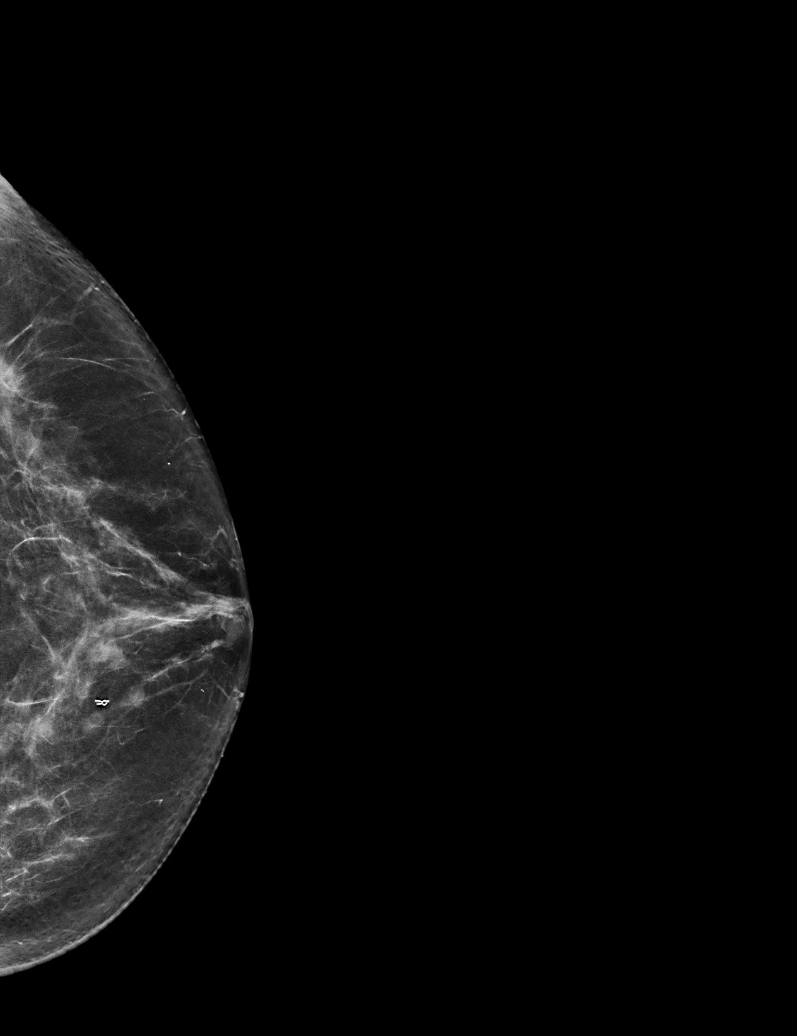

[R MLO tomo · tomo slice 35/69.0]
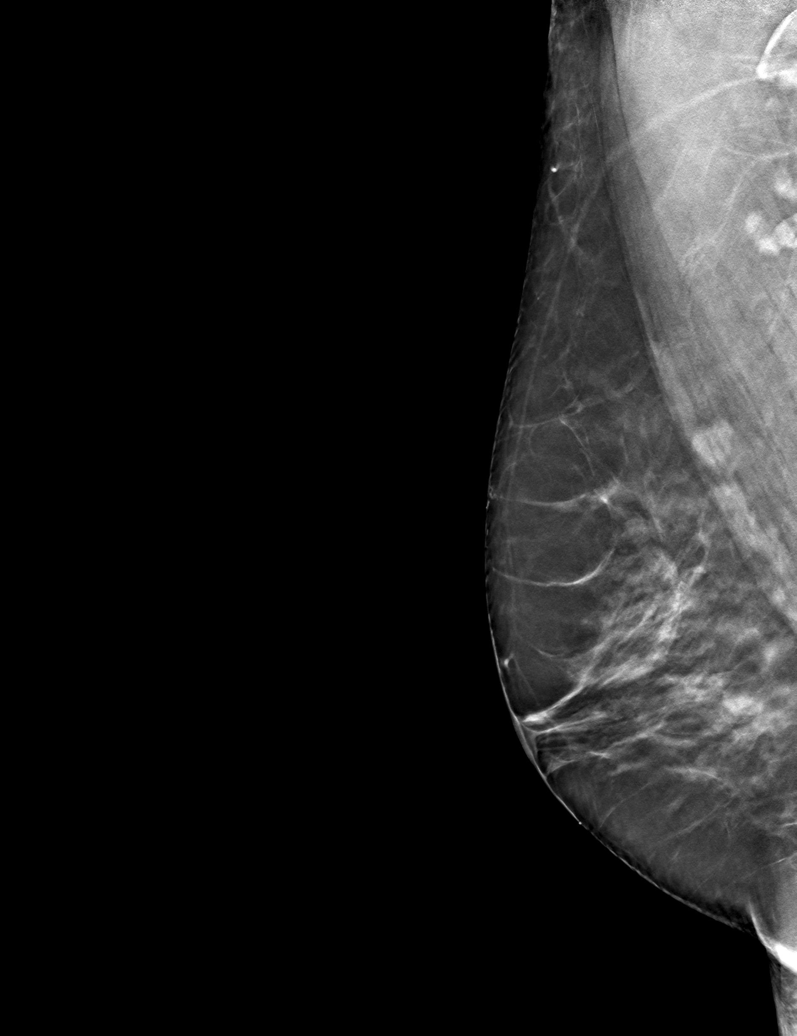

[6 of 30 positions shown; findings below may reference images not displayed]

ACR Breast Density Category c: The breast tissue is heterogeneously
dense, which may obscure small masses.
FINDINGS: There are no findings suspicious for malignancy. Images were
processed with CAD.
IMPRESSION: No mammographic evidence of malignancy. A result letter of this
screening mammogram will be mailed directly to the patient.

RECOMMENDATION:
Screening mammogram in one year. (Code:[id number])

BI-RADS CATEGORY  1: Negative.

## 2022-07-02 DIAGNOSIS — E039 Hypothyroidism, unspecified: Secondary | ICD-10-CM | POA: Diagnosis not present

## 2022-07-04 ENCOUNTER — Encounter
Admission: RE | Disposition: A | Discharge status: Home / Self Care | Payer: Self-pay | Source: Home / Self Care | Attending: Gastroenterology

## 2022-07-04 ENCOUNTER — Ambulatory Visit: Payer: BC Managed Care – PPO | Admitting: Anesthesiology

## 2022-07-04 ENCOUNTER — Other Ambulatory Visit: Payer: Self-pay

## 2022-07-04 ENCOUNTER — Ambulatory Visit
Admission: RE | Admit: 2022-07-04 | Discharge: 2022-07-04 | Disposition: A | Discharge status: Home / Self Care | Payer: BC Managed Care – PPO | Attending: Gastroenterology | Admitting: Gastroenterology

## 2022-07-04 ENCOUNTER — Encounter: Payer: Self-pay | Admitting: Gastroenterology

## 2022-07-04 DIAGNOSIS — E039 Hypothyroidism, unspecified: Secondary | ICD-10-CM | POA: Diagnosis not present

## 2022-07-04 DIAGNOSIS — Z09 Encounter for follow-up examination after completed treatment for conditions other than malignant neoplasm: Secondary | ICD-10-CM | POA: Insufficient documentation

## 2022-07-04 DIAGNOSIS — G40909 Epilepsy, unspecified, not intractable, without status epilepticus: Secondary | ICD-10-CM | POA: Diagnosis not present

## 2022-07-04 DIAGNOSIS — Z1211 Encounter for screening for malignant neoplasm of colon: Secondary | ICD-10-CM | POA: Diagnosis not present

## 2022-07-04 DIAGNOSIS — F419 Anxiety disorder, unspecified: Secondary | ICD-10-CM | POA: Diagnosis not present

## 2022-07-04 DIAGNOSIS — Z87891 Personal history of nicotine dependence: Secondary | ICD-10-CM | POA: Insufficient documentation

## 2022-07-04 HISTORY — PX: COLONOSCOPY WITH PROPOFOL: SHX5780

## 2022-07-04 SURGERY — COLONOSCOPY WITH PROPOFOL
Anesthesia: General | Site: Rectum

## 2022-07-04 MED ORDER — SODIUM CHLORIDE 0.9 % IV SOLN: INTRAVENOUS | Status: DC

## 2022-07-04 MED ORDER — PROPOFOL 10 MG/ML IV BOLUS
INTRAVENOUS | Status: DC | PRN
  Administered 2022-07-04: 70 mg via INTRAVENOUS
  Administered 2022-07-04: 50 mg via INTRAVENOUS

## 2022-07-04 MED ORDER — LACTATED RINGERS IV SOLN: INTRAVENOUS | Status: DC | PRN

## 2022-07-04 MED ORDER — LIDOCAINE HCL (CARDIAC) PF 100 MG/5ML IV SOSY
PREFILLED_SYRINGE | INTRAVENOUS | Status: DC | PRN
  Administered 2022-07-04: 50 mg via INTRAVENOUS

## 2022-07-04 MED ORDER — STERILE WATER FOR IRRIGATION IR SOLN
Status: DC | PRN
  Administered 2022-07-04: 150 mL

## 2022-07-04 SURGICAL SUPPLY — 21 items

## 2022-07-04 NOTE — Anesthesia Postprocedure Evaluation (Signed)
Anesthesia Post Note  Patient: Margaret [name]  Procedure(s) Performed: COLONOSCOPY WITH PROPOFOL (Rectum)  Patient location during evaluation: PACU Anesthesia Type: General Level of consciousness: awake and alert Pain management: pain level controlled Vital Signs Assessment: post-procedure vital signs reviewed and stable Respiratory status: spontaneous breathing, nonlabored ventilation, respiratory function stable and patient connected to nasal cannula oxygen Cardiovascular status: blood pressure returned to baseline and stable Postop Assessment: no apparent nausea or vomiting Anesthetic complications: no   No notable events documented.   Last Vitals:  Vitals:   07/04/22 1045 07/04/22 1052  BP: 115/78 112/77  Pulse: 93 84  Resp: 12 (!) 22  Temp:  36.7 C  SpO2: 98% 97%    Last Pain:  Vitals:   07/04/22 1052  TempSrc:   PainSc: 3                  Samiyah Stupka C Dionicio Shelnutt

## 2022-07-04 NOTE — Transfer of Care (Signed)
Immediate Anesthesia Transfer of Care Note  Patient: Margaret [name]  Procedure(s) Performed: COLONOSCOPY WITH PROPOFOL (Rectum)  Patient Location: PACU  Anesthesia Type: General  Level of Consciousness: awake, alert  and patient cooperative  Airway and Oxygen Therapy: Patient Spontanous Breathing and Patient connected to supplemental oxygen  Post-op Assessment: Post-op Vital signs reviewed, Patient's Cardiovascular Status Stable, Respiratory Function Stable, Patent Airway and No signs of Nausea or vomiting  Post-op Vital Signs: Reviewed and stable  Complications: No notable events documented.

## 2022-07-04 NOTE — H&P (Signed)
Midge Minium, MD [facility] [address], [address] [place], [place] [address] Phone: [phone] Fax : [phone]  Primary Care Physician:  Gracelyn Nurse, MD Primary Gastroenterologist:  Dr. Servando Snare  Pre-Procedure History & Physical: HPI:  Margaret [name] is a 62 y.o. female is here for a screening colonoscopy.   Past Medical History:  Diagnosis Date   Anxiety    Epilepsy ([facility])    Hypothyroidism    Ovarian cyst     Past Surgical History:  Procedure Laterality Date   BREAST BIOPSY Left 2012   NEG   BREAST CYST ASPIRATION Left 2011   OVARY SURGERY      Prior to Admission medications   Medication Sig Start Date End Date Taking? Authorizing Provider  divalproex (DEPAKOTE) 250 MG DR tablet Take 2 tablets (500 mg total) by mouth daily. Prescribed by neurologist Patient taking differently: Take 125 mg by mouth as directed. TAKES DIFFERENTLY - 125 MG EVERY OTHER DAY 05/13/18  Yes Eappen, Levin Bacon, MD  FLUoxetine (PROZAC) 20 MG capsule TAKE 1 CAPSULE(20 MG) BY MOUTH DAILY 09/12/21  Yes Eappen, Levin Bacon, MD  levothyroxine (SYNTHROID) 88 MCG tablet TAKE 1 TABLET(88 MCG) BY MOUTH EVERY DAY 30 TO 60 MINUTES BEFORE BREAKFAST ON AN EMPTY STOMACH AND WITH A GLASS OF WATER 01/13/22  Yes [provider]  loratadine (CLARITIN) 10 MG tablet Take by mouth.   Yes [provider]  Multiple Vitamin (MULTI-VITAMINS) TABS Take by mouth.   Yes [provider]  Obeticholic Acid 5 MG TABS Take 1 tablet by mouth daily. 05/29/22  Yes Midge Minium, MD  traZODone (DESYREL) 50 MG tablet Take 0.5-1 tablets (25-50 mg total) by mouth at bedtime as needed for sleep. 09/12/21  Yes Jomarie Longs, MD  ursodiol (ACTIGALL) 300 MG capsule Take 1 capsule (300 mg total) by mouth 3 (three) times daily. With food 05/29/22  Yes Midge Minium, MD  LORazepam (ATIVAN) 0.5 MG tablet Take 1-2 tablets (0.5-1 mg total) by mouth as directed. Take 1-2 tablets as needed 20 minutes prior to your dental procedure for  anxiety Patient not taking: Reported on 06/26/2022 09/12/21   Jomarie Longs, MD    Allergies as of 05/29/2022 - Review Complete 05/29/2022  Allergen Reaction Noted   Keppra [levetiracetam]  11/18/2017   Lexapro [escitalopram oxalate] Nausea Only 05/19/2017    Family History  Problem Relation Age of Onset   Diabetes Mother    CAD Father    Breast cancer Neg Hx     Social History   Socioeconomic History   Marital status: Married    Spouse name: hubert   Number of children: 2   Years of education: Not on file   Highest education level: High school graduate  Occupational History    Comment: part time  Tobacco Use   Smoking status: Former    Types: Cigarettes    Quit date: 03/06/2011    Years since quitting: 11.3   Smokeless tobacco: Never  Vaping Use   Vaping Use: Never used  Substance and Sexual Activity   Alcohol use: Yes    Alcohol/week: 8.0 standard drinks of alcohol    Types: 4 Glasses of wine, 4 Shots of liquor per week   Drug use: No   Sexual activity: Yes    Birth control/protection: None  Other Topics Concern   Not on file  Social History Narrative   Not on file   Social Determinants of Health   Financial Resource Strain: Low Risk  (03/05/2017)  Overall Financial Resource Strain (CARDIA)    Difficulty of Paying Living Expenses: Not hard at all  Food Insecurity: No Food Insecurity (03/05/2017)   Hunger Vital Sign    Worried About Running Out of Food in the Last Year: Never true    Ran Out of Food in the Last Year: Never true  Transportation Needs: No Transportation Needs (03/05/2017)   PRAPARE - Administrator, Civil Service (Medical): No    Lack of Transportation (Non-Medical): No  Physical Activity: Sufficiently Active (03/05/2017)   Exercise Vital Sign    Days of Exercise per Week: 7 days    Minutes of Exercise per Session: 30 min  Stress: No Stress Concern Present (03/05/2017)   [employer] of Occupational Health - Occupational  Stress Questionnaire    Feeling of Stress : Not at all  Social Connections: Moderately Isolated (03/05/2017)   Social Connection and Isolation Panel [NHANES]    Frequency of Communication with Friends and Family: Never    Frequency of Social Gatherings with Friends and Family: Never    Attends Religious Services: Never    Database administrator or Organizations: No    Attends Banker Meetings: Never    Marital Status: Married  Catering manager Violence: Not At Risk (03/05/2017)   Humiliation, Afraid, Rape, and Kick questionnaire    Fear of Current or Ex-Partner: No    Emotionally Abused: No    Physically Abused: No    Sexually Abused: No    Review of Systems: See HPI, otherwise negative ROS  Physical Exam: Ht 5\' 3"  (1.6 m)   Wt 62.1 kg   BMI 24.27 kg/m  General:   Alert,  pleasant and cooperative in NAD Head:  Normocephalic and atraumatic. Neck:  Supple; no masses or thyromegaly. Lungs:  Clear throughout to auscultation.    Heart:  Regular rate and rhythm. Abdomen:  Soft, nontender and nondistended. Normal bowel sounds, without guarding, and without rebound.   Neurologic:  Alert and  oriented x4;  grossly normal neurologically.  Impression/Plan: Margaret [name] is now here to undergo a screening colonoscopy.  Risks, benefits, and alternatives regarding colonoscopy have been reviewed with the patient.  Questions have been answered.  All parties agreeable.

## 2022-07-04 NOTE — Anesthesia Preprocedure Evaluation (Signed)
Anesthesia Evaluation  Patient identified by MRN, date of birth, ID band Patient awake    Reviewed: Allergy & Precautions, H&P , NPO status , Patient's Chart, lab work & pertinent test results  Airway Mallampati: III  TM Distance: <3 FB Neck ROM: Full    Dental  (+) Missing Normally has partial lower front, but left it out today, multiple missing teeth :   Pulmonary neg pulmonary ROS, former smoker   Pulmonary exam normal breath sounds clear to auscultation       Cardiovascular negative cardio ROS Normal cardiovascular exam Rhythm:Regular Rate:Normal     Neuro/Psych Seizures -,   Anxiety        GI/Hepatic negative GI ROS,,,Hx primary biliary cholangitis   Endo/Other  Hypothyroidism    Renal/GU negative Renal ROS  negative genitourinary   Musculoskeletal negative musculoskeletal ROS (+)    Abdominal   Peds negative pediatric ROS (+)  Hematology negative hematology ROS (+)   Anesthesia Other Findings   Reproductive/Obstetrics negative OB ROS                             Anesthesia Physical Anesthesia Plan  ASA: 2  Anesthesia Plan: General   Post-op Pain Management:    Induction: Intravenous  PONV Risk Score and Plan:   Airway Management Planned: Natural Airway and Nasal Cannula  Additional Equipment:   Intra-op Plan:   Post-operative Plan:   Informed Consent: I have reviewed the patients History and Physical, chart, labs and discussed the procedure including the risks, benefits and alternatives for the proposed anesthesia with the patient or authorized representative who has indicated his/her understanding and acceptance.     Dental Advisory Given  Plan Discussed with: Anesthesiologist, CRNA and Surgeon  Anesthesia Plan Comments: (Patient consented for risks of anesthesia including but not limited to:  - adverse reactions to medications - risk of airway placement if  required - damage to eyes, teeth, lips or other oral mucosa - nerve damage due to positioning  - sore throat or hoarseness - Damage to heart, brain, nerves, lungs, other parts of body or loss of life  Patient voiced understanding.)       Anesthesia Quick Evaluation

## 2022-07-04 NOTE — Op Note (Signed)
[facility] Patient Name: Margaret [name] Procedure Date: 07/04/2022 10:01 AM MRN: [id number] Account #: [id number] Date of Birth: [birth year] Admit Type: Outpatient Age: 62 Room: [facility] OR ROOM 01 Gender: Female Note Status: Finalized Instrument Name: [id number] Procedure:             Colonoscopy Indications:           Screening for colorectal malignant neoplasm Providers:             Midge Minium MD, MD Referring MD:          Gracelyn Nurse, MD (Referring MD) Medicines:             Propofol per Anesthesia Complications:         No immediate complications. Procedure:             Pre-Anesthesia Assessment:                        - Prior to the procedure, a History and Physical was                         performed, and patient medications and allergies were                         reviewed. The patient's tolerance of previous                         anesthesia was also reviewed. The risks and benefits                         of the procedure and the sedation options and risks                         were discussed with the patient. All questions were                         answered, and informed consent was obtained. Prior                         Anticoagulants: The patient has taken no anticoagulant                         or antiplatelet agents. ASA Grade Assessment: II - A                         patient with mild systemic disease. After reviewing                         the risks and benefits, the patient was deemed in                         satisfactory condition to undergo the procedure.                        After obtaining informed consent, the colonoscope was                         passed under direct vision. Throughout the procedure,  the patient's blood pressure, pulse, and oxygen                         saturations were monitored continuously. The                         Colonoscope was introduced through the anus  and                         advanced to the the cecum, identified by appendiceal                         orifice and ileocecal valve. The colonoscopy was                         performed without difficulty. The patient tolerated                         the procedure well. The quality of the bowel                         preparation was excellent. Findings:      The perianal and digital rectal examinations were normal.      The colon (entire examined portion) appeared normal. Impression:            - The entire examined colon is normal.                        - No specimens collected. Recommendation:        - Discharge patient to home.                        - Resume previous diet.                        - Continue present medications.                        - Repeat colonoscopy in 10 years for screening                         purposes. Procedure Code(s):     --- Professional ---                        [phone], Colonoscopy, flexible; diagnostic, including                         collection of specimen(s) by brushing or washing, when                         performed (separate procedure) Diagnosis Code(s):     --- Professional ---                        Z12.11, Encounter for screening for malignant neoplasm                         of colon CPT copyright 2022 American Medical Association. All rights reserved. The codes documented in this report are preliminary and upon coder review may  be  revised to meet current compliance requirements. Midge Minium MD, MD 07/04/2022 10:32:10 AM This report has been signed electronically. Number of Addenda: 0 Note Initiated On: 07/04/2022 10:01 AM Scope Withdrawal Time: 0 hours 6 minutes 42 seconds  Total Procedure Duration: 0 hours 11 minutes 27 seconds  Estimated Blood Loss:  Estimated blood loss: none.      [facility]

## 2022-07-08 ENCOUNTER — Encounter: Payer: Self-pay | Admitting: Gastroenterology

## 2022-07-10 DIAGNOSIS — F411 Generalized anxiety disorder: Secondary | ICD-10-CM | POA: Diagnosis not present

## 2022-07-10 DIAGNOSIS — E039 Hypothyroidism, unspecified: Secondary | ICD-10-CM | POA: Diagnosis not present

## 2022-07-10 DIAGNOSIS — Z0001 Encounter for general adult medical examination with abnormal findings: Secondary | ICD-10-CM | POA: Diagnosis not present

## 2022-07-10 DIAGNOSIS — R569 Unspecified convulsions: Secondary | ICD-10-CM | POA: Diagnosis not present

## 2022-08-06 ENCOUNTER — Ambulatory Visit: Payer: BC Managed Care – PPO | Admitting: Psychiatry

## 2022-08-26 ENCOUNTER — Ambulatory Visit: Payer: BC Managed Care – PPO | Admitting: Psychiatry

## 2022-09-08 ENCOUNTER — Encounter: Payer: Self-pay | Admitting: Gastroenterology

## 2022-09-23 ENCOUNTER — Other Ambulatory Visit
Admission: RE | Admit: 2022-09-23 | Discharge: 2022-09-23 | Disposition: A | Discharge status: Home / Self Care | Payer: BC Managed Care – PPO | Source: Ambulatory Visit | Attending: Gastroenterology | Admitting: Gastroenterology

## 2022-09-23 DIAGNOSIS — Z760 Encounter for issue of repeat prescription: Secondary | ICD-10-CM | POA: Insufficient documentation

## 2022-09-23 DIAGNOSIS — K743 Primary biliary cirrhosis: Secondary | ICD-10-CM | POA: Diagnosis not present

## 2022-09-23 LAB — HEPATIC FUNCTION PANEL
ALT: 29 U/L (ref 0–44)
AST: 22 U/L (ref 15–41)
Albumin: 4.3 g/dL (ref 3.5–5.0)
Alkaline Phosphatase: 176 U/L — ABNORMAL HIGH (ref 38–126)
Bilirubin, Direct: <0.1 mg/dL (ref 0.0–0.2)
Total Bilirubin: 0.7 mg/dL (ref 0.3–1.2)
Total Protein: 8.2 g/dL — ABNORMAL HIGH (ref 6.5–8.1)

## 2022-09-25 ENCOUNTER — Encounter: Payer: Self-pay | Admitting: Gastroenterology

## 2022-10-08 ENCOUNTER — Telehealth (INDEPENDENT_AMBULATORY_CARE_PROVIDER_SITE_OTHER): Payer: BC Managed Care – PPO | Admitting: Psychiatry

## 2022-10-08 DIAGNOSIS — F411 Generalized anxiety disorder: Secondary | ICD-10-CM

## 2022-10-08 DIAGNOSIS — G4701 Insomnia due to medical condition: Secondary | ICD-10-CM

## 2022-10-08 DIAGNOSIS — F4010 Social phobia, unspecified: Secondary | ICD-10-CM

## 2022-10-08 MED ORDER — TRAZODONE HCL 50 MG PO TABS
25.0000 mg | ORAL_TABLET | Freq: Every evening | ORAL | 0 refills | Status: DC | PRN
Start: 2022-10-08 — End: 2023-01-20

## 2022-10-08 MED ORDER — FLUOXETINE HCL 20 MG PO CAPS: ORAL_CAPSULE | ORAL | 0 refills | Status: DC

## 2022-10-08 NOTE — Progress Notes (Signed)
Patient connected for the appointment however currently out of state, hence will not be able to complete the telemedicine appointment.  Patient provided a new appointment for follow-up.

## 2022-11-06 ENCOUNTER — Encounter: Payer: Self-pay | Admitting: Psychiatry

## 2022-11-06 ENCOUNTER — Telehealth (INDEPENDENT_AMBULATORY_CARE_PROVIDER_SITE_OTHER): Payer: BC Managed Care – PPO | Admitting: Psychiatry

## 2022-11-06 DIAGNOSIS — F411 Generalized anxiety disorder: Secondary | ICD-10-CM | POA: Diagnosis not present

## 2022-11-06 DIAGNOSIS — F4010 Social phobia, unspecified: Secondary | ICD-10-CM

## 2022-11-06 DIAGNOSIS — G4701 Insomnia due to medical condition: Secondary | ICD-10-CM | POA: Diagnosis not present

## 2022-11-06 NOTE — Progress Notes (Signed)
Virtual Visit via Video Note  I connected with Margaret [name] on 11/06/22 at  3:30 PM EDT by a video enabled telemedicine application and verified that I am speaking with the correct person using two identifiers.  Location Provider Location : [facility] Patient Location : Home  Participants: Patient , Provider    I discussed the limitations of evaluation and management by telemedicine and the availability of in person appointments. The patient expressed understanding and agreed to proceed.   I discussed the assessment and treatment plan with the patient. The patient was provided an opportunity to ask questions and all were answered. The patient agreed with the plan and demonstrated an understanding of the instructions.   The patient was advised to call back or seek an in-person evaluation if the symptoms worsen or if the condition fails to improve as anticipated.    [facility] MD OP Progress Note  11/07/2022 8:15 AM Margaret [name]  MRN:  [id number]  Chief Complaint:  Chief Complaint  Patient presents with   Follow-up   Anxiety   Depression   Medication Refill   HPI: Margaret [name] is a 62 year old Caucasian female who is married, employed, lives in [place], has a history of GAD, social anxiety disorder, insomnia, seizure disorder, hypothyroidism, history of elevated liver enzymes, primary biliary cholangitis, was evaluated by telemedicine today.  Patient today reports she is currently doing well.  She had COVID-19 infection however recovered completely.  She only had mild symptoms.  Patient reports her current anxiety symptoms are manageable.  She continues to take the Prozac.  Denies side effects.  Patient reports sleep as good.  She continues to be compliant on the trazodone.  Denies side effects.  Patient reports appetite is fair.  Patient reports overall she is doing well.  She has not had any seizure-like spells in a long time.  She is no longer taking the Depakote.  Patient denies  any suicidality, homicidality or perceptual disturbances.  Patient denies any other concerns today.    Visit Diagnosis:    ICD-10-CM   1. GAD (generalized anxiety disorder)  F41.1     2. Insomnia due to medical condition  G47.01    Anxiety    3. Social anxiety disorder  F40.10       Past Psychiatric History: I have reviewed past psychiatric history from progress note on 01/14/2018.  Past Medical History: Patient with history of seizures. Past Medical History:  Diagnosis Date   Anxiety    Epilepsy (HCC)    Hypothyroidism    Ovarian cyst     Past Surgical History:  Procedure Laterality Date   BREAST BIOPSY Left 2012   NEG   BREAST CYST ASPIRATION Left 2011   COLONOSCOPY WITH PROPOFOL N/A 07/04/2022   Procedure: COLONOSCOPY WITH PROPOFOL;  Surgeon: Midge Minium, MD;  Location: [facility];  Service: Endoscopy;  Laterality: N/A;   OVARY SURGERY      Family Psychiatric History: I have reviewed family psychiatric history from progress note on 01/14/2018.  Family History:  Family History  Problem Relation Age of Onset   Diabetes Mother    CAD Father    Breast cancer Neg Hx     Social History: I have reviewed social history from my progress note on 01/14/2018. Social History   Socioeconomic History   Marital status: Married    Spouse name: hubert   Number of children: 2   Years of education: Not on file   Highest education level:  High school graduate  Occupational History    Comment: part time  Tobacco Use   Smoking status: Former    Current packs/day: 0.00    Types: Cigarettes    Quit date: 03/06/2011    Years since quitting: 11.6   Smokeless tobacco: Never  Vaping Use   Vaping status: Never Used  Substance and Sexual Activity   Alcohol use: Yes    Alcohol/week: 8.0 standard drinks of alcohol    Types: 4 Glasses of wine, 4 Shots of liquor per week   Drug use: No   Sexual activity: Yes    Birth control/protection: None  Other Topics Concern   Not  on file  Social History Narrative   Not on file   Social Determinants of Health   Financial Resource Strain: Low Risk  (03/05/2017)   Overall Financial Resource Strain (CARDIA)    Difficulty of Paying Living Expenses: Not hard at all  Food Insecurity: No Food Insecurity (03/05/2017)   Hunger Vital Sign    Worried About Running Out of Food in the Last Year: Never true    Ran Out of Food in the Last Year: Never true  Transportation Needs: No Transportation Needs (03/05/2017)   PRAPARE - Administrator, Civil Service (Medical): No    Lack of Transportation (Non-Medical): No  Physical Activity: Sufficiently Active (03/05/2017)   Exercise Vital Sign    Days of Exercise per Week: 7 days    Minutes of Exercise per Session: 30 min  Stress: No Stress Concern Present (03/05/2017)   [employer] of Occupational Health - Occupational Stress Questionnaire    Feeling of Stress : Not at all  Social Connections: Moderately Isolated (03/05/2017)   Social Connection and Isolation Panel [NHANES]    Frequency of Communication with Friends and Family: Never    Frequency of Social Gatherings with Friends and Family: Never    Attends Religious Services: Never    Database administrator or Organizations: No    Attends Banker Meetings: Never    Marital Status: Married    Allergies:  Allergies  Allergen Reactions   Keppra [Levetiracetam]    Lexapro [Escitalopram Oxalate] Nausea Only    Metabolic Disorder Labs: No results found for: "HGBA1C", "MPG" No results found for: "PROLACTIN" No results found for: "CHOL", "TRIG", "HDL", "CHOLHDL", "VLDL", "LDLCALC" Lab Results  Component Value Date   TSH 1.514 10/04/2016    Therapeutic Level Labs: No results found for: "LITHIUM" Lab Results  Component Value Date   VALPROATE 54 11/18/2017   VALPROATE 49 (L) 05/20/2017   No results found for: "CBMZ"  Current Medications: Current Outpatient Medications  Medication Sig  Dispense Refill   FLUoxetine (PROZAC) 20 MG capsule TAKE 1 CAPSULE(20 MG) BY MOUTH DAILY 90 capsule 0   levothyroxine (SYNTHROID) 88 MCG tablet TAKE 1 TABLET(88 MCG) BY MOUTH EVERY DAY 30 TO 60 MINUTES BEFORE BREAKFAST ON AN EMPTY STOMACH AND WITH A GLASS OF WATER     loratadine (CLARITIN) 10 MG tablet Take by mouth.     LORazepam (ATIVAN) 0.5 MG tablet Take 1-2 tablets (0.5-1 mg total) by mouth as directed. Take 1-2 tablets as needed 20 minutes prior to your dental procedure for anxiety (Patient not taking: Reported on 06/26/2022) 3 tablet 0   Multiple Vitamin (MULTI-VITAMINS) TABS Take by mouth.     Obeticholic Acid 5 MG TABS Take 1 tablet by mouth daily. 90 tablet 3   traZODone (DESYREL) 50 MG tablet Take 0.5-1  tablets (25-50 mg total) by mouth at bedtime as needed for sleep. 90 tablet 0   ursodiol (ACTIGALL) 300 MG capsule Take 1 capsule (300 mg total) by mouth 3 (three) times daily. With food 90 capsule 11   No current facility-administered medications for this visit.     Musculoskeletal: Strength & Muscle Tone:  UTA Gait & Station:  Seated Patient leans: N/A  Psychiatric Specialty Exam: Review of Systems  Psychiatric/Behavioral: Negative.      There were no vitals taken for this visit.There is no height or weight on file to calculate BMI.  General Appearance: Casual  Eye Contact:  Fair  Speech:  Normal Rate  Volume:  Normal  Mood:  Euthymic  Affect:  Congruent  Thought Process:  Goal Directed and Descriptions of Associations: Intact  Orientation:  Full (Time, Place, and Person)  Thought Content: Logical   Suicidal Thoughts:  No  Homicidal Thoughts:  No  Memory:  Immediate;   Fair Recent;   Fair Remote;   Fair  Judgement:  Fair  Insight:  Fair  Psychomotor Activity:  Normal  Concentration:  Concentration: Fair and Attention Span: Fair  Recall:  [employer] of Knowledge: Fair  Language: Fair  Akathisia:  No  Handed:  Right  AIMS (if indicated): not done  Assets:   Communication Skills Desire for Improvement Housing Social Support  ADL's:  Intact  Cognition: WNL  Sleep:  Fair   Screenings: GAD-7    Flowsheet Row Office Visit from 02/04/2022 in [facility] Office Visit from 04/24/2021 in [facility]  Total GAD-7 Score 1 2      PHQ2-9    Flowsheet Row Office Visit from 02/04/2022 in [facility] Office Visit from 09/12/2021 in [facility] Office Visit from 04/24/2021 in [facility]  PHQ-2 Total Score 0 0 0  PHQ-9 Total Score 0 -- --      Flowsheet Row Video Visit from 11/06/2022 in [facility] Admission (Discharged) from 07/04/2022 in [facility] Office Visit from 02/04/2022 in [facility]  C-SSRS RISK CATEGORY No Risk No Risk No Risk        Assessment and Plan: Margaret [name] is a 62 year old Caucasian female who has a history of anxiety disorder, seizure disorder, hypothyroidism, primary biliary cholangitis was evaluated by telemedicine today.  Patient is currently stable with regards to her mood symptoms and sleep.  Plan as noted below.  Plan GAD-stable Prozac 20 mg p.o. daily-reduced dosage Lorazepam 0.5 mg-take 1 to 2 tablets prior to dental procedure.  Insomnia-stable Trazodone 25-50 mg p.o. nightly as needed  Social anxiety disorder-stable Continue Prozac as prescribed.   Follow-up in clinic in 6 months or sooner in person.  Consent: Patient/Guardian gives verbal consent for treatment and assignment of benefits for services provided during this visit. Patient/Guardian expressed understanding and agreed to proceed.   This note was generated in part or whole with voice recognition software. Voice recognition is usually  quite accurate but there are transcription errors that can and very often do occur. I apologize for any typographical errors that were not detected and corrected.    Jomarie Longs, MD 11/07/2022, 8:15 AM

## 2022-11-26 ENCOUNTER — Other Ambulatory Visit: Payer: Self-pay | Admitting: Internal Medicine

## 2022-11-26 DIAGNOSIS — Z1231 Encounter for screening mammogram for malignant neoplasm of breast: Secondary | ICD-10-CM

## 2022-12-17 ENCOUNTER — Encounter: Payer: Self-pay | Admitting: Gastroenterology

## 2022-12-17 ENCOUNTER — Telehealth: Payer: Self-pay

## 2022-12-17 NOTE — Telephone Encounter (Signed)
PA has been submitted via [email or web address] awaiting response 

## 2022-12-22 ENCOUNTER — Encounter: Payer: Self-pay | Admitting: Gastroenterology

## 2022-12-22 DIAGNOSIS — K743 Primary biliary cirrhosis: Secondary | ICD-10-CM

## 2022-12-22 DIAGNOSIS — R748 Abnormal levels of other serum enzymes: Secondary | ICD-10-CM

## 2022-12-24 NOTE — Telephone Encounter (Signed)
Request Reference Number: [id number]. OCALIVA TAB 5MG  is approved through 12/22/2023

## 2023-01-05 ENCOUNTER — Encounter: Payer: Self-pay | Admitting: Gastroenterology

## 2023-01-05 DIAGNOSIS — E039 Hypothyroidism, unspecified: Secondary | ICD-10-CM | POA: Diagnosis not present

## 2023-01-05 NOTE — Addendum Note (Signed)
Addended by: Roena Malady on: 01/05/2023 04:51 PM   Modules accepted: Orders

## 2023-01-06 MED ORDER — LIVDELZI 10 MG PO CAPS: 10.0000 mg | ORAL_CAPSULE | Freq: Every day | ORAL | 5 refills | Status: DC

## 2023-01-07 DIAGNOSIS — R300 Dysuria: Secondary | ICD-10-CM | POA: Diagnosis not present

## 2023-01-08 DIAGNOSIS — K743 Primary biliary cirrhosis: Secondary | ICD-10-CM | POA: Diagnosis not present

## 2023-01-08 DIAGNOSIS — F411 Generalized anxiety disorder: Secondary | ICD-10-CM | POA: Diagnosis not present

## 2023-01-08 DIAGNOSIS — E039 Hypothyroidism, unspecified: Secondary | ICD-10-CM | POA: Diagnosis not present

## 2023-01-08 DIAGNOSIS — R569 Unspecified convulsions: Secondary | ICD-10-CM | POA: Diagnosis not present

## 2023-01-09 DIAGNOSIS — D485 Neoplasm of uncertain behavior of skin: Secondary | ICD-10-CM | POA: Diagnosis not present

## 2023-01-09 DIAGNOSIS — D2261 Melanocytic nevi of right upper limb, including shoulder: Secondary | ICD-10-CM | POA: Diagnosis not present

## 2023-01-09 DIAGNOSIS — D2262 Melanocytic nevi of left upper limb, including shoulder: Secondary | ICD-10-CM | POA: Diagnosis not present

## 2023-01-09 DIAGNOSIS — D2272 Melanocytic nevi of left lower limb, including hip: Secondary | ICD-10-CM | POA: Diagnosis not present

## 2023-01-09 DIAGNOSIS — D235 Other benign neoplasm of skin of trunk: Secondary | ICD-10-CM | POA: Diagnosis not present

## 2023-01-09 DIAGNOSIS — L538 Other specified erythematous conditions: Secondary | ICD-10-CM | POA: Diagnosis not present

## 2023-01-09 DIAGNOSIS — D225 Melanocytic nevi of trunk: Secondary | ICD-10-CM | POA: Diagnosis not present

## 2023-01-13 ENCOUNTER — Ambulatory Visit
Admission: RE | Admit: 2023-01-13 | Discharge: 2023-01-13 | Disposition: A | Discharge status: Home / Self Care | Payer: BC Managed Care – PPO | Source: Ambulatory Visit | Attending: Internal Medicine | Admitting: Internal Medicine

## 2023-01-13 DIAGNOSIS — Z1231 Encounter for screening mammogram for malignant neoplasm of breast: Secondary | ICD-10-CM | POA: Insufficient documentation

## 2023-01-19 ENCOUNTER — Telehealth: Payer: Self-pay

## 2023-01-19 ENCOUNTER — Other Ambulatory Visit: Payer: Self-pay | Admitting: Psychiatry

## 2023-01-19 DIAGNOSIS — F411 Generalized anxiety disorder: Secondary | ICD-10-CM

## 2023-01-19 DIAGNOSIS — F4010 Social phobia, unspecified: Secondary | ICD-10-CM

## 2023-01-19 DIAGNOSIS — G4701 Insomnia due to medical condition: Secondary | ICD-10-CM

## 2023-01-19 NOTE — Telephone Encounter (Signed)
[facility] called [phone] stating they needed clarification on the patients medication. Pt advised them she is switching medication from [place] [facility] [place]. Please return their call. Caller did not leave name.

## 2023-01-20 ENCOUNTER — Telehealth: Payer: Self-pay

## 2023-01-20 MED ORDER — LIVDELZI 10 MG PO CAPS: 10.0000 mg | ORAL_CAPSULE | Freq: Every day | ORAL | 3 refills | Status: AC

## 2023-01-20 NOTE — Addendum Note (Signed)
Addended by: Roena Malady on: 01/20/2023 05:12 PM   Modules accepted: Orders

## 2023-01-20 NOTE — Telephone Encounter (Signed)
Submitted via [email or web address]

## 2023-05-06 ENCOUNTER — Encounter: Payer: Self-pay | Admitting: Gastroenterology

## 2023-05-06 DIAGNOSIS — K743 Primary biliary cirrhosis: Secondary | ICD-10-CM

## 2023-05-06 MED ORDER — URSODIOL 300 MG PO CAPS: 300.0000 mg | ORAL_CAPSULE | Freq: Three times a day (TID) | ORAL | 5 refills | Status: AC

## 2023-05-13 ENCOUNTER — Telehealth: Payer: Self-pay

## 2023-05-13 NOTE — Telephone Encounter (Signed)
 I have faxed the referral to [facility] x 2... Will check on the referral in 1-2 weeks

## 2023-05-25 ENCOUNTER — Encounter: Payer: Self-pay | Admitting: Psychiatry

## 2023-05-25 ENCOUNTER — Ambulatory Visit: Payer: BC Managed Care – PPO | Admitting: Psychiatry

## 2023-05-25 ENCOUNTER — Other Ambulatory Visit: Payer: Self-pay

## 2023-05-25 VITALS — BP 120/72 | HR 71 | Temp 97.3°F | Ht 63.0 in | Wt 139.4 lb

## 2023-05-25 DIAGNOSIS — F4010 Social phobia, unspecified: Secondary | ICD-10-CM | POA: Diagnosis not present

## 2023-05-25 DIAGNOSIS — G4701 Insomnia due to medical condition: Secondary | ICD-10-CM

## 2023-05-25 DIAGNOSIS — F411 Generalized anxiety disorder: Secondary | ICD-10-CM

## 2023-05-25 MED ORDER — HYDROXYZINE HCL 10 MG PO TABS: 10.0000 mg | ORAL_TABLET | Freq: Every day | ORAL | 0 refills | Status: AC | PRN

## 2023-05-25 NOTE — Patient Instructions (Signed)
 Hydroxyzine Capsules or Tablets What is this medication? HYDROXYZINE (hye DROX i zeen) treats the symptoms of allergies and allergic reactions. It may also be used to treat anxiety or cause drowsiness before a procedure. It works by blocking histamine, a substance released by the body during an allergic reaction. It belongs to a group of medications called antihistamines. This medicine may be used for other purposes; ask your health care provider or pharmacist if you have questions. COMMON BRAND NAME(S): ANX, Atarax, Rezine, Vistaril What should I tell my care team before I take this medication? They need to know if you have any of these conditions: Glaucoma Heart disease Irregular heartbeat or rhythm Kidney disease Liver disease Lung or breathing disease, such as asthma Stomach or intestine problems Thyroid disease Trouble passing urine An unusual or allergic reaction to hydroxyzine, other medications, foods, dyes or preservatives Pregnant or trying to get pregnant Breastfeeding How should I use this medication? Take this medication by mouth with a full glass of water. Take it as directed on the prescription label at the same time every day. You can take it with or without food. If it upsets your stomach, take it with food. Talk to your care team about the use of this medication in children. While it may be prescribed for children as young as 6 years for selected conditions, precautions do apply. People 65 years and older may have a stronger reaction and need a smaller dose. Overdosage: If you think you have taken too much of this medicine contact a poison control center or emergency room at once. NOTE: This medicine is only for you. Do not share this medicine with others. What if I miss a dose? If you miss a dose, take it as soon as you can. If it is almost time for your next dose, take only that dose. Do not take double or extra doses. What may interact with this medication? Do not  take this medication with any of the following: Cisapride Dronedarone Pimozide Thioridazine This medication may also interact with the following: Alcohol Antihistamines for allergy, cough, and cold Atropine Barbiturate medications for sleep or seizures, such as phenobarbital Certain antibiotics, such as erythromycin or clarithromycin Certain medications for anxiety or sleep Certain medications for bladder problems, such as oxybutynin or tolterodine Certain medications for irregular heartbeat Certain medications for mental health conditions Certain medications for Parkinson disease, such as benztropine, trihexyphenidyl Certain medications for seizures, such as phenobarbital or primidone Certain medications for stomach problems, such as dicyclomine or hyoscyamine Certain medications for travel sickness, such as scopolamine Ipratropium Opioid medications for pain Other medications that cause heart rhythm changes, such as dofetilide This list may not describe all possible interactions. Give your health care provider a list of all the medicines, herbs, non-prescription drugs, or dietary supplements you use. Also tell them if you smoke, drink alcohol, or use illegal drugs. Some items may interact with your medicine. What should I watch for while using this medication? Visit your care team for regular checks on your progress. Tell your care team if your symptoms do not start to get better or if they get worse. This medication may affect your coordination, reaction time, or judgment. Do not drive or operate machinery until you know how this medication affects you. Sit up or stand slowly to reduce the risk of dizzy or fainting spells. Drinking alcohol with this medication can increase the risk of these side effects. Your mouth may get dry. Chewing sugarless gum or sucking hard candy  and drinking plenty of water may help. Contact your care team if the problem does not go away or is severe. This  medication may cause dry eyes and blurred vision. If you wear contact lenses, you may feel some discomfort. Lubricating eye drops may help. See your care team if the problem does not go away or is severe. If you are receiving skin tests for allergies, tell your care team you are taking this medication. What side effects may I notice from receiving this medication? Side effects that you should report to your care team as soon as possible: Allergic reactions--skin rash, itching, hives, swelling of the face, lips, tongue, or throat Heart rhythm changes--fast or irregular heartbeat, dizziness, feeling faint or lightheaded, chest pain, trouble breathing Side effects that usually do not require medical attention (report to your care team if they continue or are bothersome): Confusion Drowsiness Dry mouth Hallucinations Headache This list may not describe all possible side effects. Call your doctor for medical advice about side effects. You may report side effects to FDA at 1-800-FDA-1088. Where should I keep my medication? Keep out of the reach of children and pets. Store at room temperature between 15 and 30 degrees C (59 and 86 degrees F). Keep container tightly closed. Throw away any unused medication after the expiration date. NOTE: This sheet is a summary. It may not cover all possible information. If you have questions about this medicine, talk to your doctor, pharmacist, or health care provider.  2024 Elsevier/Gold Standard (2021-10-04 00:00:00)

## 2023-05-25 NOTE — Progress Notes (Unsigned)
 BH MD OP Progress Note  05/25/2023 12:53 PM Margaret [name]  MRN:  [id number]  Chief Complaint:  Chief Complaint  Patient presents with   Follow-up   Anxiety   Insomnia   Medication Refill   HPI: Margaret [name] is a 63 year old Caucasian female who is married, employed, lives in [place], has a history of GAD, social anxiety disorder, insomnia, seizure disorder, hypothyroidism, history of elevated liver enzymes, primary biliary cholangitis was evaluated in office today.  She experiences intermittent anxiety flare-ups, which she manages with trazodone at night for sleep. She discontinued Prozac due to concerns about feeling 'high' and dependency. Her anxiety is described as manageable, with no symptoms of depression or seizures. She uses Ativan as needed for dental procedures.  She has primary biliary cholangitis and reports a slight elevation in ALP on her liver function tests. Despite this, she remains active and feels well overall. She is scheduled for a follow-up at [facility] in May.  She maintains an active lifestyle, regularly hiking and using an exercise bike. She is involved in caring for her grandchildren, who have recently moved to [place], and enjoys spending time with her family and traveling.   Denies any suicidality, homicidality or perceptual disturbances.  She is not interested in restarting Prozac which she has been noncompliant on.  She would like to have a medication which she can take as needed.  Discussed several different options including hydroxyzine and propranolol.  Patient would like a trial of hydroxyzine.  Aware she should not be using it regularly especially with her history of primary biliary cholangitis.   Visit Diagnosis:    ICD-10-CM   1. GAD (generalized anxiety disorder)  F41.1 hydrOXYzine (ATARAX) 10 MG tablet    2. Insomnia due to medical condition  G47.01    Anxiety    3. Social anxiety disorder  F40.10 hydrOXYzine (ATARAX) 10 MG tablet       Past Psychiatric History: I have reviewed past psychiatric history from progress note on 01/14/2018.  Past Medical History: Patient has a history of seizures. Past Medical History:  Diagnosis Date   Anxiety    Epilepsy (HCC)    Hypothyroidism    Ovarian cyst     Past Surgical History:  Procedure Laterality Date   BREAST BIOPSY Left 2012   NEG   BREAST CYST ASPIRATION Left 2011   COLONOSCOPY WITH PROPOFOL N/A 07/04/2022   Procedure: COLONOSCOPY WITH PROPOFOL;  Surgeon: Midge Minium, MD;  Location: [facility];  Service: Endoscopy;  Laterality: N/A;   OVARY SURGERY      Family Psychiatric History: I have reviewed family psychiatric history from progress note on 01/14/2018.  Family History:  Family History  Problem Relation Age of Onset   Diabetes Mother    CAD Father    Breast cancer Neg Hx     Social History: I have reviewed social history from progress note on 01/14/2018. Social History   Socioeconomic History   Marital status: Married    Spouse name: hubert   Number of children: 2   Years of education: Not on file   Highest education level: High school graduate  Occupational History    Comment: part time  Tobacco Use   Smoking status: Former    Current packs/day: 0.00    Types: Cigarettes    Quit date: 03/06/2011    Years since quitting: 12.2   Smokeless tobacco: Never  Vaping Use   Vaping status: Never Used  Substance and Sexual Activity  Alcohol use: Yes    Alcohol/week: 8.0 standard drinks of alcohol    Types: 4 Glasses of wine, 4 Shots of liquor per week   Drug use: No   Sexual activity: Yes    Birth control/protection: None  Other Topics Concern   Not on file  Social History Narrative   Not on file   Social Drivers of Health   Financial Resource Strain: Low Risk  (03/05/2017)   Overall Financial Resource Strain (CARDIA)    Difficulty of Paying Living Expenses: Not hard at all  Food Insecurity: No Food Insecurity (03/05/2017)    Hunger Vital Sign    Worried About Running Out of Food in the Last Year: Never true    Ran Out of Food in the Last Year: Never true  Transportation Needs: No Transportation Needs (03/05/2017)   PRAPARE - Administrator, Civil Service (Medical): No    Lack of Transportation (Non-Medical): No  Physical Activity: Sufficiently Active (03/05/2017)   Exercise Vital Sign    Days of Exercise per Week: 7 days    Minutes of Exercise per Session: 30 min  Stress: No Stress Concern Present (03/05/2017)   [employer] of Occupational Health - Occupational Stress Questionnaire    Feeling of Stress : Not at all  Social Connections: Moderately Isolated (03/05/2017)   Social Connection and Isolation Panel [NHANES]    Frequency of Communication with Friends and Family: Never    Frequency of Social Gatherings with Friends and Family: Never    Attends Religious Services: Never    Database administrator or Organizations: No    Attends Banker Meetings: Never    Marital Status: Married    Allergies:  Allergies  Allergen Reactions   Keppra [Levetiracetam]    Lexapro [Escitalopram Oxalate] Nausea Only    Metabolic Disorder Labs: No results found for: "HGBA1C", "MPG" No results found for: "PROLACTIN" No results found for: "CHOL", "TRIG", "HDL", "CHOLHDL", "VLDL", "LDLCALC" Lab Results  Component Value Date   TSH 1.514 10/04/2016    Therapeutic Level Labs: No results found for: "LITHIUM" Lab Results  Component Value Date   VALPROATE 54 11/18/2017   VALPROATE 49 (L) 05/20/2017   No results found for: "CBMZ"  Current Medications: Current Outpatient Medications  Medication Sig Dispense Refill   hydrOXYzine (ATARAX) 10 MG tablet Take 1-2 tablets (10-20 mg total) by mouth daily as needed for anxiety. 60 tablet 0   levothyroxine (SYNTHROID) 88 MCG tablet TAKE 1 TABLET(88 MCG) BY MOUTH EVERY DAY 30 TO 60 MINUTES BEFORE BREAKFAST ON AN EMPTY STOMACH AND WITH A GLASS OF  WATER     loratadine (CLARITIN) 10 MG tablet Take by mouth.     Multiple Vitamin (MULTI-VITAMINS) TABS Take by mouth.     Seladelpar Lysine (LIVDELZI) 10 MG CAPS Take 10 mg by mouth daily. 90 capsule 3   traZODone (DESYREL) 50 MG tablet TAKE 1/2 TO 1 TABLET(25 TO 50 MG) BY MOUTH AT BEDTIME AS NEEDED FOR SLEEP 90 tablet 1   ursodiol (ACTIGALL) 300 MG capsule Take 1 capsule (300 mg total) by mouth 3 (three) times daily. With food 90 capsule 5   LORazepam (ATIVAN) 0.5 MG tablet Take 1-2 tablets (0.5-1 mg total) by mouth as directed. Take 1-2 tablets as needed 20 minutes prior to your dental procedure for anxiety (Patient not taking: Reported on 05/25/2023) 3 tablet 0   No current facility-administered medications for this visit.     Musculoskeletal: Strength & Muscle  Tone: within normal limits Gait & Station: normal Patient leans: N/A  Psychiatric Specialty Exam: Review of Systems  Psychiatric/Behavioral: Negative.      Blood pressure 120/72, pulse 71, temperature (!) 97.3 F (36.3 C), temperature source Skin, height 5\' 3"  (1.6 m), weight 139 lb 6.4 oz (63.2 kg).Body mass index is 24.69 kg/m.  General Appearance: Casual  Eye Contact:  Good  Speech:  Clear and Coherent  Volume:  Normal  Mood:  Euthymic  Affect:  Full Range  Thought Process:  Goal Directed and Descriptions of Associations: Intact  Orientation:  Full (Time, Place, and Person)  Thought Content: Logical   Suicidal Thoughts:  No  Homicidal Thoughts:  No  Memory:  Immediate;   Fair Recent;   Fair Remote;   Fair  Judgement:  Fair  Insight:  Fair  Psychomotor Activity:  Normal  Concentration:  Concentration: Fair and Attention Span: Fair  Recall:  [employer] of Knowledge: Fair  Language: Fair  Akathisia:  No  Handed:  Right  AIMS (if indicated): not done  Assets:  Desire for Improvement Housing Social Support  ADL's:  Intact  Cognition: WNL  Sleep:  Fair   Screenings: GAD-7    Flowsheet Row Office Visit  from 05/25/2023 in [facility] Office Visit from 02/04/2022 in [facility] Office Visit from 04/24/2021 in [facility]  Total GAD-7 Score 0 1 2      PHQ2-9    Flowsheet Row Office Visit from 05/25/2023 in [facility] Office Visit from 02/04/2022 in [facility] Office Visit from 09/12/2021 in [facility] Office Visit from 04/24/2021 in [facility]  PHQ-2 Total Score 0 0 0 0  PHQ-9 Total Score -- 0 -- --      Flowsheet Row Office Visit from 05/25/2023 in [facility] Video Visit from 11/06/2022 in [facility] Admission (Discharged) from 07/04/2022 in [facility] PERIOP  C-SSRS RISK CATEGORY No Risk No Risk No Risk        Assessment and Plan: Margaret [name] is a 63 year old Caucasian female with history of anxiety disorder, seizure disorder, hypothyroidism, primary biliary cholangitis was evaluated in office today.  Patient is currently noncompliant on fluoxetine, is interested in initiation of medication that she can use as needed for anxiety, discussed assessment and plan as noted below.   Generalized anxiety disorder/Social anxiety disorder-stable Experiences anxiety with occasional flare-ups. Previously discontinued Prozac due to dependency concerns and overstimulation. Currently manages anxiety with trazodone at night, aiding sleep. Discussed as-needed options: hydroxyzine and propranolol. Hydroxyzine, an antihistamine, can cause drowsiness, constipation, and dry mouth, especially with frequent use. Propranolol, a beta-blocker, aids social and performance anxiety but requires monitoring of blood pressure and heart  rate. Hydroxyzine was chosen for infrequent use, despite potential reduced clearance in PBC, leading to accumulation and side effects. Propranolol has no specific PBC contraindication but requires caution in hepatic disease. - Prescribe Hydroxyzine 10 mg or 20 mg as needed for anxiety, with instructions to monitor for side effects and avoid frequent use. - Educate on potential side effects of Hydroxyzine, including drowsiness, constipation, and dry mouth. - Advise to monitor liver function due to potential reduced clearance in PBC. - Schedule follow-up in 4-5 months via video.  Insomnia-stable Currently reports sleep is overall good on  the current medication regimen. - Continue Trazodone 25-50 mg at bedtime as needed  Follow-up - Follow-up in clinic in 4 to 5 months or sooner if needed.   Consent: Patient/Guardian gives verbal consent for treatment and assignment of benefits for services provided during this visit. Patient/Guardian expressed understanding and agreed to proceed.  Discussed the use of a AI scribe software for clinical note transcription with the patient, who gave verbal consent to proceed.  This note was generated in part or whole with voice recognition software. Voice recognition is usually quite accurate but there are transcription errors that can and very often do occur. I apologize for any typographical errors that were not detected and corrected.     Jomarie Longs, MD 05/26/2023, 4:32 PM

## 2023-05-29 NOTE — Telephone Encounter (Signed)
Referral faxed x 2.

## 2023-06-04 NOTE — Telephone Encounter (Signed)
 I spoke to [facility] and they confirmed they do have the referral for pt. Pt is also aware

## 2023-06-26 ENCOUNTER — Telehealth: Payer: Self-pay

## 2023-06-26 NOTE — Telephone Encounter (Signed)
 Prior Siegfried Dress has been submitted via [email or web address]  Awaiting response

## 2023-06-26 NOTE — Telephone Encounter (Signed)
 Medication Name: Livdelzi  Cap 10mg  Decision Notes: APPROVALINFORMATION Patient DOB: [birth year] Status of Request: Approval GPI/NDC: [id number] [facility] approved your prior authorization request for LIVDELZI  CAP 10MG . The current approval expires on 06/25/2024.

## 2023-07-07 ENCOUNTER — Observation Stay
Admission: EM | Admit: 2023-07-07 | Discharge: 2023-07-08 | Disposition: A | Discharge status: Home / Self Care | Attending: Internal Medicine | Admitting: Internal Medicine

## 2023-07-07 ENCOUNTER — Other Ambulatory Visit: Payer: Self-pay

## 2023-07-07 ENCOUNTER — Emergency Department

## 2023-07-07 ENCOUNTER — Observation Stay
Admit: 2023-07-07 | Discharge: 2023-07-07 | Disposition: A | Discharge status: Home / Self Care | Attending: Student | Admitting: Student

## 2023-07-07 ENCOUNTER — Observation Stay

## 2023-07-07 DIAGNOSIS — I214 Non-ST elevation (NSTEMI) myocardial infarction: Secondary | ICD-10-CM | POA: Diagnosis not present

## 2023-07-07 DIAGNOSIS — K8309 Other cholangitis: Secondary | ICD-10-CM | POA: Diagnosis not present

## 2023-07-07 DIAGNOSIS — E039 Hypothyroidism, unspecified: Secondary | ICD-10-CM | POA: Diagnosis not present

## 2023-07-07 DIAGNOSIS — Z87891 Personal history of nicotine dependence: Secondary | ICD-10-CM | POA: Insufficient documentation

## 2023-07-07 DIAGNOSIS — Z79899 Other long term (current) drug therapy: Secondary | ICD-10-CM | POA: Diagnosis not present

## 2023-07-07 DIAGNOSIS — G40909 Epilepsy, unspecified, not intractable, without status epilepticus: Secondary | ICD-10-CM

## 2023-07-07 DIAGNOSIS — R55 Syncope and collapse: Secondary | ICD-10-CM | POA: Diagnosis not present

## 2023-07-07 DIAGNOSIS — G40A09 Absence epileptic syndrome, not intractable, without status epilepticus: Secondary | ICD-10-CM | POA: Diagnosis not present

## 2023-07-07 DIAGNOSIS — I2489 Other forms of acute ischemic heart disease: Secondary | ICD-10-CM | POA: Diagnosis not present

## 2023-07-07 DIAGNOSIS — K743 Primary biliary cirrhosis: Secondary | ICD-10-CM | POA: Diagnosis not present

## 2023-07-07 DIAGNOSIS — E785 Hyperlipidemia, unspecified: Secondary | ICD-10-CM | POA: Diagnosis not present

## 2023-07-07 LAB — COMPREHENSIVE METABOLIC PANEL WITH GFR
ALT: 26 U/L (ref 0–44)
AST: 23 U/L (ref 15–41)
Albumin: 3.7 g/dL (ref 3.5–5.0)
Alkaline Phosphatase: 81 U/L (ref 38–126)
Anion gap: 4 — ABNORMAL LOW (ref 5–15)
BUN: 18 mg/dL (ref 8–23)
CO2: 24 mmol/L (ref 22–32)
Calcium: 8.8 mg/dL — ABNORMAL LOW (ref 8.9–10.3)
Chloride: 107 mmol/L (ref 98–111)
Creatinine, Ser: 1.2 mg/dL — ABNORMAL HIGH (ref 0.44–1.00)
GFR, Estimated: 52 mL/min — ABNORMAL LOW (ref >60)
Glucose, Bld: 164 mg/dL — ABNORMAL HIGH (ref 70–99)
Potassium: 3.8 mmol/L (ref 3.5–5.1)
Sodium: 135 mmol/L (ref 135–145)
Total Bilirubin: 0.6 mg/dL (ref 0.0–1.2)
Total Protein: 7.1 g/dL (ref 6.5–8.1)

## 2023-07-07 LAB — CBC WITH DIFFERENTIAL/PLATELET
Abs Immature Granulocytes: 0.07 10*3/uL (ref 0.00–0.07)
Basophils Absolute: 0.1 10*3/uL (ref 0.0–0.1)
Basophils Relative: 1 %
Eosinophils Absolute: 0.2 10*3/uL (ref 0.0–0.5)
Eosinophils Relative: 2 %
HCT: 35.7 % — ABNORMAL LOW (ref 36.0–46.0)
Hemoglobin: 12 g/dL (ref 12.0–15.0)
Immature Granulocytes: 1 %
Lymphocytes Relative: 24 %
Lymphs Abs: 2 10*3/uL (ref 0.7–4.0)
MCH: 30.6 pg (ref 26.0–34.0)
MCHC: 33.6 g/dL (ref 30.0–36.0)
MCV: 91.1 fL (ref 80.0–100.0)
Monocytes Absolute: 0.5 10*3/uL (ref 0.1–1.0)
Monocytes Relative: 6 %
Neutro Abs: 5.7 10*3/uL (ref 1.7–7.7)
Neutrophils Relative %: 66 %
Platelets: 190 10*3/uL (ref 150–400)
RBC: 3.92 MIL/uL (ref 3.87–5.11)
RDW: 13.3 % (ref 11.5–15.5)
WBC: 8.5 10*3/uL (ref 4.0–10.5)
nRBC: 0 % (ref 0.0–0.2)

## 2023-07-07 LAB — TROPONIN I (HIGH SENSITIVITY)
Troponin I (High Sensitivity): 63 ng/L — ABNORMAL HIGH (ref <18)
Troponin I (High Sensitivity): 65 ng/L — ABNORMAL HIGH (ref <18)

## 2023-07-07 LAB — CK: Total CK: 70 U/L (ref 38–234)

## 2023-07-07 LAB — LIPID PANEL
Cholesterol: 209 mg/dL — ABNORMAL HIGH (ref 0–200)
HDL: 65 mg/dL (ref >40)
LDL Cholesterol: 132 mg/dL — ABNORMAL HIGH (ref 0–99)
Total CHOL/HDL Ratio: 3.2 ratio
Triglycerides: 59 mg/dL (ref <150)
VLDL: 12 mg/dL (ref 0–40)

## 2023-07-07 LAB — T4, FREE: Free T4: 1.16 ng/dL — ABNORMAL HIGH (ref 0.61–1.12)

## 2023-07-07 LAB — HIV ANTIBODY (ROUTINE TESTING W REFLEX): HIV Screen 4th Generation wRfx: NONREACTIVE

## 2023-07-07 LAB — TSH: TSH: 1.833 u[IU]/mL (ref 0.350–4.500)

## 2023-07-07 LAB — HEMOGLOBIN A1C
Hgb A1c MFr Bld: 5.4 % (ref 4.8–5.6)
Mean Plasma Glucose: 108.28 mg/dL

## 2023-07-07 MED ORDER — URSODIOL 300 MG PO CAPS
300.0000 mg | ORAL_CAPSULE | Freq: Three times a day (TID) | ORAL | Status: DC
  Administered 2023-07-07 – 2023-07-08 (×2): 300 mg via ORAL
  Filled 2023-07-07 (×4): qty 1

## 2023-07-07 MED ORDER — ATORVASTATIN CALCIUM 20 MG PO TABS
80.0000 mg | ORAL_TABLET | Freq: Every day | ORAL | Status: DC
  Filled 2023-07-07: qty 4

## 2023-07-07 MED ORDER — ACETAMINOPHEN 325 MG PO TABS: 650.0000 mg | ORAL_TABLET | Freq: Four times a day (QID) | ORAL | Status: DC | PRN

## 2023-07-07 MED ORDER — SELADELPAR LYSINE 10 MG PO CAPS: 10.0000 mg | ORAL_CAPSULE | Freq: Every day | ORAL | Status: DC

## 2023-07-07 MED ORDER — LEVOTHYROXINE SODIUM 88 MCG PO TABS
88.0000 ug | ORAL_TABLET | Freq: Every day | ORAL | Status: DC
  Administered 2023-07-08: 88 ug via ORAL
  Filled 2023-07-07: qty 1

## 2023-07-07 MED ORDER — ONDANSETRON HCL 4 MG PO TABS: 4.0000 mg | ORAL_TABLET | Freq: Four times a day (QID) | ORAL | Status: DC | PRN

## 2023-07-07 MED ORDER — ENOXAPARIN SODIUM 40 MG/0.4ML IJ SOSY
40.0000 mg | PREFILLED_SYRINGE | INTRAMUSCULAR | Status: DC
  Administered 2023-07-07: 40 mg via SUBCUTANEOUS
  Filled 2023-07-07: qty 0.4

## 2023-07-07 MED ORDER — ONDANSETRON HCL 4 MG/2ML IJ SOLN
4.0000 mg | Freq: Once | INTRAMUSCULAR | Status: AC
  Administered 2023-07-07: 4 mg via INTRAVENOUS
  Filled 2023-07-07: qty 2

## 2023-07-07 MED ORDER — TRAZODONE HCL 50 MG PO TABS
25.0000 mg | ORAL_TABLET | Freq: Every evening | ORAL | Status: DC | PRN
  Administered 2023-07-08: 25 mg via ORAL
  Filled 2023-07-07: qty 1

## 2023-07-07 MED ORDER — SODIUM CHLORIDE 0.9 % IV SOLN: INTRAVENOUS | Status: AC

## 2023-07-07 MED ORDER — SODIUM CHLORIDE 0.9% FLUSH
3.0000 mL | Freq: Two times a day (BID) | INTRAVENOUS | Status: DC
  Administered 2023-07-08: 3 mL via INTRAVENOUS

## 2023-07-07 MED ORDER — ONDANSETRON HCL 4 MG/2ML IJ SOLN: 4.0000 mg | Freq: Four times a day (QID) | INTRAMUSCULAR | Status: DC | PRN

## 2023-07-07 NOTE — ED Notes (Signed)
 Pt will be transferred to inpatient unit when EEG is complete. EEG in progress.

## 2023-07-07 NOTE — ED Notes (Signed)
 Patient denies pain and is resting comfortably.

## 2023-07-07 NOTE — Consult Note (Addendum)
 [facility] CLINIC CARDIOLOGY CONSULT NOTE       Patient ID: Margaret [name] MRN: [id number] DOB/AGE: [birth year] 63 y.o.  Admit date: 07/07/2023 Referring Physician Dr. Pablo Boards Primary Physician Little Riff, MD  Primary Cardiologist None Reason for Consultation syncope  HPI: Margaret [name] is a 63 y.o. female  with a past medical history of hyperlipidemia (cannot take statins per PCP due to [facility]), hypothyroidism, primary biliary cirrhosis, epilepsy, no known other cardiac history who presented to the ED on 07/07/2023 for a syncopal episode. Cardiology was consulted for further evaluation.   Patient reported to the ED after a syncopal episode that occurred at home. Patient states she was sitting on her couch drinking coffee and suddenly felt "warm" prior to her syncopal episode. Episode was unwitnessed and she doesn't know how long she was out. Patient states she woke up and felt diaphoretic and noticed urinary incontinence. Patient denies any chest pain, SOB or dizziness before or after her syncopal episode. Workup in the ED notable for Na 135, K 3.8, Cr 1.20, Hgb 12, plts 190. CK within normal limits. CT head with no acute abnormalities. Trops minimally elevated and flat 63 > 65. EKG in ED sinus bradycardia with no sign of AVB or acute ischemic changes.  At the time of my evaluation this afternoon, patient is laying in ED stretcher with family at bedside in no acute distress. Discussed her symptoms and event in further. Patient states she is feeling well today and denies any cardiac symptoms. Patient seems more concerned with her primary biliary cirrhosis. BP and HR are stable. Per tele there has been no evidence of any arrhythmias or high-grade AV block.  Review of systems complete and found to be negative unless listed above    Past Medical History:  Diagnosis Date   Anxiety    Epilepsy (HCC)    Hypothyroidism    Ovarian cyst     Past Surgical History:  Procedure Laterality  Date   BREAST BIOPSY Left 2012   NEG   BREAST CYST ASPIRATION Left 2011   COLONOSCOPY WITH PROPOFOL  N/A 07/04/2022   Procedure: COLONOSCOPY WITH PROPOFOL ;  Surgeon: Marnee Sink, MD;  Location: [facility];  Service: Endoscopy;  Laterality: N/A;   OVARY SURGERY      (Not in a hospital admission)  Social History   Socioeconomic History   Marital status: Married    Spouse name: hubert   Number of children: 2   Years of education: Not on file   Highest education level: High school graduate  Occupational History    Comment: part time  Tobacco Use   Smoking status: Former    Current packs/day: 0.00    Types: Cigarettes    Quit date: 03/06/2011    Years since quitting: 12.3   Smokeless tobacco: Never  Vaping Use   Vaping status: Never Used  Substance and Sexual Activity   Alcohol use: Yes    Alcohol/week: 8.0 standard drinks of alcohol    Types: 4 Glasses of wine, 4 Shots of liquor per week   Drug use: No   Sexual activity: Yes    Birth control/protection: None  Other Topics Concern   Not on file  Social History Narrative   Not on file   Social Drivers of Health   Financial Resource Strain: Low Risk  (03/05/2017)   Overall Financial Resource Strain (CARDIA)    Difficulty of Paying Living Expenses: Not hard at all  Food Insecurity: No Food Insecurity (  03/05/2017)   Hunger Vital Sign    Worried About Running Out of Food in the Last Year: Never true    Ran Out of Food in the Last Year: Never true  Transportation Needs: No Transportation Needs (03/05/2017)   PRAPARE - Administrator, Civil Service (Medical): No    Lack of Transportation (Non-Medical): No  Physical Activity: Sufficiently Active (03/05/2017)   Exercise Vital Sign    Days of Exercise per Week: 7 days    Minutes of Exercise per Session: 30 min  Stress: No Stress Concern Present (03/05/2017)   [employer] of Occupational Health - Occupational Stress Questionnaire    Feeling of  Stress : Not at all  Social Connections: Moderately Isolated (03/05/2017)   Social Connection and Isolation Panel [NHANES]    Frequency of Communication with Friends and Family: Never    Frequency of Social Gatherings with Friends and Family: Never    Attends Religious Services: Never    Database administrator or Organizations: No    Attends Banker Meetings: Never    Marital Status: Married  Catering manager Violence: Not At Risk (03/05/2017)   Humiliation, Afraid, Rape, and Kick questionnaire    Fear of Current or Ex-Partner: No    Emotionally Abused: No    Physically Abused: No    Sexually Abused: No    Family History  Problem Relation Age of Onset   Diabetes Mother    CAD Father    Breast cancer Neg Hx      Vitals:   07/07/23 0802 07/07/23 0803 07/07/23 0900 07/07/23 1030  BP: 139/89  128/80 (!) 139/98  Pulse: 82 66 71 80  Resp: 16 14 19 15   Temp:      TempSrc:      SpO2: 98% 100% 97% 100%  Weight:      Height:        PHYSICAL EXAM General: Well appearing female, well nourished, in no acute distress. HEENT: Normocephalic and atraumatic. Neck: No JVD.  Lungs: Normal respiratory effort on room air. Clear bilaterally to auscultation. No wheezes, crackles, rhonchi.  Heart: HRRR. Normal S1 and S2 without gallops or murmurs.  Abdomen: Non-distended appearing.  Msk: Normal strength and tone for age. Extremities: Warm and well perfused. No clubbing, cyanosis. No edema.  Neuro: Alert and oriented X 3. Psych: Answers questions appropriately.   Labs: Basic Metabolic Panel: Recent Labs    07/07/23 0733  NA 135  K 3.8  CL 107  CO2 24  GLUCOSE 164*  BUN 18  CREATININE 1.20*  CALCIUM 8.8*   Liver Function Tests: Recent Labs    07/07/23 0733  AST 23  ALT 26  ALKPHOS 81  BILITOT 0.6  PROT 7.1  ALBUMIN 3.7   No results for input(s): "LIPASE", "AMYLASE" in the last 72 hours. CBC: Recent Labs    07/07/23 0733  WBC 8.5  NEUTROABS 5.7  HGB  12.0  HCT 35.7*  MCV 91.1  PLT 190   Cardiac Enzymes: Recent Labs    07/07/23 0733 07/07/23 0923  CKTOTAL 70  --   TROPONINIHS 63* 65*   BNP: No results for input(s): "BNP" in the last 72 hours. D-Dimer: No results for input(s): "DDIMER" in the last 72 hours. Hemoglobin A1C: No results for input(s): "HGBA1C" in the last 72 hours. Fasting Lipid Panel: No results for input(s): "CHOL", "HDL", "LDLCALC", "TRIG", "CHOLHDL", "LDLDIRECT" in the last 72 hours. Thyroid  Function Tests: Recent Labs  07/07/23 0733  TSH 1.833   Anemia Panel: No results for input(s): "VITAMINB12", "FOLATE", "FERRITIN", "TIBC", "IRON", "RETICCTPCT" in the last 72 hours.   Radiology: CT Head Wo Contrast Result Date: 07/07/2023 CLINICAL DATA:  Syncope/presyncope, cerebrovascular cause suspected. EXAM: CT HEAD WITHOUT CONTRAST TECHNIQUE: Contiguous axial images were obtained from the base of the skull through the vertex without intravenous contrast. RADIATION DOSE REDUCTION: This exam was performed according to the departmental dose-optimization program which includes automated exposure control, adjustment of the mA and/or kV according to patient size and/or use of iterative reconstruction technique. COMPARISON:  Head MRI 01/21/2018 FINDINGS: Brain: There is no evidence of an acute infarct, intracranial hemorrhage, mass, midline shift, or extra-axial fluid collection. Cerebral volume is normal. The ventricles are normal in size. Vascular: No hyperdense vessel. Skull: No fracture or suspicious lesion. Sinuses/Orbits: Visualized paranasal sinuses and mastoid air cells are clear. Unremarkable orbits. Other: None. IMPRESSION: Negative head CT. Electronically Signed   By: Aundra Lee M.D.   On: 07/07/2023 08:25    ECHO ordered  TELEMETRY reviewed by me 07/07/2023: sinus rhythm, rate 80s  EKG reviewed by me: sinus bradycardia, rate 58 bpm with no acute ischemic changes  Data reviewed by me 07/07/2023: last 24h  vitals tele labs imaging I/O ED provider note, admission H&P  Principal Problem:   Syncope Active Problems:   Atypical absence seizure (HCC)   NSTEMI (non-ST elevated myocardial infarction) (HCC)    ASSESSMENT AND PLAN:  Margaret [name] is a 63 y.o. female  with a past medical history of hyperlipidemia (cannot take statins per PCP due to [facility]), hypothyroidism, primary biliary cirrhosis, epilepsy, no known other cardiac history who presented to the ED on 07/07/2023 for a syncopal episode. Cardiology was consulted for further evaluation.   # Syncope # Demand Ischemia # Hyperlipidemia Patient with history of epilepsy and no known cardiac history reported to the ED due to an witnessed syncopal episode. Patient denies any chest pain or SOB before or after the event. Trops mildly elevated and flat 63 > 65. EKG in ED sinus bradycardia with no sign of AVB or acute ischemic changes. BP and HR are stable this afternoon.  -Echo ordered -Plan to place holter monitor on patient prior to discharge to further evaluate syncopal episode outpatient. -Mildly elevated and flat troponins most consistent with demand/supply mismatch and not ACS.  -No plan for further cardiac diagnostics at this time.  - DC statin as patient has been told not to take this in the past by hepatology due to her primary biliary cirrhosis.   This patient's plan of care was discussed and created with Dr. Parks Bollman and he is in agreement.  Signed: Hamp Levine, PA-C  07/07/2023, 11:41 AM [facility]

## 2023-07-07 NOTE — Assessment & Plan Note (Signed)
 Stable  Cont home regimen including

## 2023-07-07 NOTE — Assessment & Plan Note (Addendum)
 Troponin 60s on presentation in the setting of syncope with nonspecific nausea and diaphoresis- some concern for anginal equivalent  EKG sinus rhythm Heart score 5-6 Will plan for 2D ECHO Risk stratification labs  Start ASA  Statin  Cardiology consult  Follow up recommendations

## 2023-07-07 NOTE — Evaluation (Signed)
 Occupational Therapy Evaluation Patient Details Name: Margaret [name] MRN: [id number] DOB: 10/13/1960 Today's Date: 07/07/2023   History of Present Illness   Pt is a 63 y.o. female presenting with syncope, mild NSTEMI. CT head WNL. PMH of anxiety, epilepsy, primary biliary cholangitis, hypothyroidism.     Clinical Impressions Pt was seen for OT evaluation this date. PTA, pt was living in a 1 level home with 4 STE with her husband. Pt was IND with all tasks, working PRN as a CNA, hiking 3-6 miles a weekend with her husband, driving, etc. No AD use. She performed all bed mobility, transfers and ambulation without AD with IND and no symptoms of lightheadedness, dizziness, nausea, etc. Reports she feels she is back to her baseline. Orthostatic vitals taken and entered-WNL. IND with all toileting and LB dressing tasks during session. OT to sign off in house with no acute needs or need for follow up therapy services upon DC.      If plan is discharge home, recommend the following:         Functional Status Assessment   Patient has not had a recent decline in their functional status     Equipment Recommendations   None recommended by OT     Recommendations for Other Services         Precautions/Restrictions   Precautions Precautions: None Restrictions Weight Bearing Restrictions Per Provider Order: No     Mobility Bed Mobility Overal bed mobility: Independent                  Transfers Overall transfer level: Independent                        Balance Overall balance assessment: Independent                                         ADL either performed or assessed with clinical judgement   ADL Overall ADL's : Independent                                       General ADL Comments: ambulated to bathroom and back in ED independently-performed all toileting tasks with IND and LB dressing     Vision          Perception         Praxis         Pertinent Vitals/Pain Pain Assessment Pain Assessment: No/denies pain     Extremity/Trunk Assessment Upper Extremity Assessment Upper Extremity Assessment: Overall WFL for tasks assessed   Lower Extremity Assessment Lower Extremity Assessment: Overall WFL for tasks assessed       Communication Communication Communication: No apparent difficulties   Cognition Arousal: Alert Behavior During Therapy: WFL for tasks assessed/performed                                 Following commands: Intact       Cueing  General Comments      no dizziness, nausea or lightheadedness during mobility in ED hallway/bathroom   Exercises     Shoulder Instructions      Home Living Family/patient expects to be discharged to:: Private residence Living Arrangements: Spouse/significant other Available Help at Discharge:  Family;Available PRN/intermittently Type of Home: House Home Access: Stairs to enter [employer] of Steps: 4 STE front and back; bil HR Entrance Stairs-Rails: Can reach both;Right;Left Home Layout: One level     Bathroom Shower/Tub: Chief Strategy Officer: Standard     Home Equipment: None          Prior Functioning/Environment Prior Level of Function : Independent/Modified Independent;Working/employed;Driving             Mobility Comments: IND, no falls, hikes 6 miles over the weekend ADLs Comments: IND, 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[employer], drives, grocery shops    OT Problem List:     OT Treatment/Interventions:        OT Goals(Current goals can be found in the care plan section)       OT Frequency:       Co-evaluation              AM-PAC OT "6 Clicks" Daily Activity     Outcome Measure Help from another person eating meals?: None Help from another person taking care of personal grooming?: None Help from another person toileting, which includes using toliet,  bedpan, or urinal?: None Help from another person bathing (including washing, rinsing, drying)?: None Help from another person to put on and taking off regular upper body clothing?: None Help from another person to put on and taking off regular lower body clothing?: None 6 Click Score: 24   End of Session Nurse Communication: Mobility status  Activity Tolerance: Patient tolerated treatment well Patient left: in bed;with call bell/phone within reach;with family/visitor present  OT Visit Diagnosis: Dizziness and giddiness (R42)                Time: 1316-1340 OT Time Calculation (min): 24 min Charges:  OT General Charges $OT Visit: 1 Visit OT Evaluation $OT Eval Low Complexity: 1 Low OT Treatments $Self Care/Home Management : 8-22 mins Cannen Dupras, OTR/L 07/07/23, 2:57 PM  Clarrissa Shimkus E Febe Champa 07/07/2023, 2:55 PM

## 2023-07-07 NOTE — ED Triage Notes (Signed)
 Pt to the ED via [facility] for syncopal episode. Pt did have an episode of emesis and urinary incontinence after syncope. Pt was A&Ox4 at time of EMS arrival. EMS did not get an EKG on patient. Pt denies pain and reports continued nausea and hot flashes  BP 127/80 HR 62 99% CBG 143 97.3 oral

## 2023-07-07 NOTE — H&P (Signed)
 History and Physical    Patient: Margaret [name]:[id number] DOB: May 09, 1960 DOA: 07/07/2023 DOS: the patient was seen and examined on 07/07/2023 PCP: Little Riff, MD  Patient coming from: Home  Chief Complaint:  Chief Complaint  Patient presents with   Loss of Consciousness   HPI: Margaret [name] is a 63 y.o. female with medical history significant of anxiety, epilepsy, primary biliary cholangitis, hypothyroidism presenting with syncope, mild NSTEMI.  Patient reports sitting at home when she suddenly began to blackout.  Patient reports waking up on the floor having urinated herself.  Episode was unwitnessed.  Patient denies any weakness or dizziness prior to the event.  After waking patient reports significant nausea, diaphoresis and malaise.  Per the patient, nausea diaphoresis and malaise persisted for multiple hours even after calling EMS.  Patient denies any prior episodes like this in the past.  Does have remote history of?  Seizure disorder.  Was on brief course of antiepileptics and then stopped.  Denies any tobacco or alcohol use.  P.o. intake has been stable.  No chest pain or shortness of breath.  No dysuria. Presented to the ER afebrile, hemodynamically stable.  Satting well on room air.  White count 8.5, hemoglobin 12, platelets 190.  Troponin 63-65.  Creatinine 1.2.  Glucose 164.  TSH 1.16.  CT head within normal limits. Review of Systems: As mentioned in the history of present illness. All other systems reviewed and are negative. Past Medical History:  Diagnosis Date   Anxiety    Epilepsy (HCC)    Hypothyroidism    Ovarian cyst    Past Surgical History:  Procedure Laterality Date   BREAST BIOPSY Left 2012   NEG   BREAST CYST ASPIRATION Left 2011   COLONOSCOPY WITH PROPOFOL  N/A 07/04/2022   Procedure: COLONOSCOPY WITH PROPOFOL ;  Surgeon: Marnee Sink, MD;  Location: [facility];  Service: Endoscopy;  Laterality: N/A;   OVARY SURGERY     Social History:   reports that she quit smoking about 12 years ago. Her smoking use included cigarettes. She has never used smokeless tobacco. She reports current alcohol use of about 8.0 standard drinks of alcohol per week. She reports that she does not use drugs.  Allergies  Allergen Reactions   Keppra  Edid.Duff ]    Lexapro  [Escitalopram  Oxalate] Nausea Only    Family History  Problem Relation Age of Onset   Diabetes Mother    CAD Father    Breast cancer Neg Hx     Prior to Admission medications   Medication Sig Start Date End Date Taking? Authorizing Provider  hydrOXYzine  (ATARAX ) 10 MG tablet Take 1-2 tablets (10-20 mg total) by mouth daily as needed for anxiety. 05/25/23  Yes Eappen, Saramma, MD  levothyroxine  (SYNTHROID ) 88 MCG tablet TAKE 1 TABLET(88 MCG) BY MOUTH EVERY DAY 30 TO 60 MINUTES BEFORE BREAKFAST ON AN EMPTY STOMACH AND WITH A GLASS OF WATER  01/13/22  Yes [provider]  loratadine (CLARITIN) 10 MG tablet Take 10 mg by mouth daily as needed for allergies.   Yes [provider]  Multiple Vitamin (MULTI-VITAMINS) TABS Take 1 tablet by mouth daily.   Yes [provider]  Seladelpar Lysine (LIVDELZI) 10 MG CAPS Take 10 mg by mouth daily. 01/20/23  Yes Wohl, Darren, MD  traZODone  (DESYREL ) 50 MG tablet TAKE 1/2 TO 1 TABLET(25 TO 50 MG) BY MOUTH AT BEDTIME AS NEEDED FOR SLEEP 01/20/23  Yes Eappen, Saramma, MD  ursodiol  (ACTIGALL ) 300 MG capsule Take  1 capsule (300 mg total) by mouth 3 (three) times daily. With food 05/06/23  Yes Marnee Sink, MD  LORazepam  (ATIVAN ) 0.5 MG tablet Take 1-2 tablets (0.5-1 mg total) by mouth as directed. Take 1-2 tablets as needed 20 minutes prior to your dental procedure for anxiety Patient not taking: Reported on 05/25/2023 09/12/21   Eappen, Saramma, MD    Physical Exam: Vitals:   07/07/23 0802 07/07/23 0803 07/07/23 0900 07/07/23 1030  BP: 139/89  128/80 (!) 139/98  Pulse: 82 66 71 80  Resp: 16 14 19 15   Temp:      TempSrc:       SpO2: 98% 100% 97% 100%  Weight:      Height:       Physical Exam Constitutional:      Appearance: She is normal weight.  HENT:     Head: Normocephalic and atraumatic.     Nose: Nose normal.  Eyes:     Pupils: Pupils are equal, round, and reactive to light.  Cardiovascular:     Rate and Rhythm: Normal rate and regular rhythm.  Pulmonary:     Effort: Pulmonary effort is normal.  Musculoskeletal:        General: Normal range of motion.  Skin:    General: Skin is warm.  Neurological:     General: No focal deficit present.  Psychiatric:        Mood and Affect: Mood normal.     Data Reviewed:  There are no new results to review at this time.  CT Head Wo Contrast CLINICAL DATA:  Syncope/presyncope, cerebrovascular cause suspected.  EXAM: CT HEAD WITHOUT CONTRAST  TECHNIQUE: Contiguous axial images were obtained from the base of the skull through the vertex without intravenous contrast.  RADIATION DOSE REDUCTION: This exam was performed according to the departmental dose-optimization program which includes automated exposure control, adjustment of the mA and/or kV according to patient size and/or use of iterative reconstruction technique.  COMPARISON:  Head MRI 01/21/2018  FINDINGS: Brain: There is no evidence of an acute infarct, intracranial hemorrhage, mass, midline shift, or extra-axial fluid collection. Cerebral volume is normal. The ventricles are normal in size.  Vascular: No hyperdense vessel.  Skull: No fracture or suspicious lesion.  Sinuses/Orbits: Visualized paranasal sinuses and mastoid air cells are clear. Unremarkable orbits.  Other: None.  IMPRESSION: Negative head CT.  Electronically Signed   By: Aundra Lee M.D.   On: 07/07/2023 08:25  Lab Results  Component Value Date   WBC 8.5 07/07/2023   HGB 12.0 07/07/2023   HCT 35.7 (L) 07/07/2023   MCV 91.1 07/07/2023   PLT 190 07/07/2023   Last metabolic panel Lab Results  Component  Value Date   GLUCOSE 164 (H) 07/07/2023   NA 135 07/07/2023   K 3.8 07/07/2023   CL 107 07/07/2023   CO2 24 07/07/2023   BUN 18 07/07/2023   CREATININE 1.20 (H) 07/07/2023   GFRNONAA 52 (L) 07/07/2023   CALCIUM 8.8 (L) 07/07/2023   PROT 7.1 07/07/2023   ALBUMIN 3.7 07/07/2023   BILITOT 0.6 07/07/2023   ALKPHOS 81 07/07/2023   AST 23 07/07/2023   ALT 26 07/07/2023   ANIONGAP 4 (L) 07/07/2023    Assessment and Plan: * Syncope Positive syncopal event at home with noted secondary nausea weakness, urinary incontinence associated with event Will check EEG to rule out seizure event CT head within normal limits Will check 2D echo Check orthostatics IV fluid hydration Monitor  NSTEMI (non-ST elevated  myocardial infarction) (HCC) Troponin 60s on presentation in the setting of syncope with nonspecific nausea and diaphoresis- some concern for anginal equivalent  EKG sinus rhythm Heart score 5-6 Will plan for 2D ECHO Risk stratification labs  Start ASA  Statin  Cardiology consult  Follow up recommendations       Primary biliary cholangitis (HCC) Stable  Cont home regimen including   Atypical absence seizure (HCC) Will check EEG x 1 in setting of syncopal event with noted urinary incontinence      Advance Care Planning:   Code Status: Full Code   Consults: Cardiology   Family Communication: Husband at the bedside   Severity of Illness: The appropriate patient status for this patient is OBSERVATION. Observation status is judged to be reasonable and necessary in order to provide the required intensity of service to ensure the patient's safety. The patient's presenting symptoms, physical exam findings, and initial radiographic and laboratory data in the context of their medical condition is felt to place them at decreased risk for further clinical deterioration. Furthermore, it is anticipated that the patient will be medically stable for discharge from the hospital within  2 midnights of admission.   Author: Corrinne Din, MD 07/07/2023 11:43 AM  For on call review [email or web address].

## 2023-07-07 NOTE — ED Provider Notes (Signed)
 [facility] Provider Note    Event Date/Time   First MD Initiated Contact with Patient 07/07/23 [phone]     (approximate)   History   Loss of Consciousness   HPI  Margaret [name] is a 63 year old female with history of episodes of primary biliary cholangitis, hypothyroidism, transient altered awareness episodes presenting to the emergency department for evaluation following a syncopal episode.  Patient woke up this morning at her normal time.  When she was sitting on the couch she felt a chill and then had an unwitnessed syncopal episode.  When she woke up she felt very warm and was diaphoretic.  She did have an episode of urinary incontinence.With EMS, she had a episode of vomiting, continues to feel nauseous.  Denies chest pain, shortness of breath, headache, numbness, tingling, focal weakness, abdominal pain.  Reports a history of seizure-like episodes, but states that she is no longer on any medicine for this as it was thought that the medication could affect her liver.  Reports she has not had a episode in a few years.  I did review her visit with neurology from 07/03/2021.  At that time, patient was noted to have improving episodes of altered awareness that was thought possibly related to her anxiety.  At that time, she was weaned off of Depakote .  Prior studies include brain MRI without acute findings and normal routine EEG in 2019.       Physical Exam   Triage Vital Signs: ED Triage Vitals  Encounter Vitals Group     BP 07/07/23 0725 (!) 150/87     Systolic BP Percentile --      Diastolic BP Percentile --      Pulse Rate 07/07/23 0725 61     Resp 07/07/23 0725 14     Temp 07/07/23 0725 98.6 F (37 C)     Temp Source 07/07/23 0725 Oral     SpO2 07/07/23 0725 98 %     Weight 07/07/23 0722 138 lb (62.6 kg)     Height 07/07/23 0722 5\' 3"  (1.6 m)     Head Circumference --      Peak Flow --      Pain Score 07/07/23 0722 0     Pain Loc --      Pain  Education --      Exclude from Growth Chart --     Most recent vital signs: Vitals:   07/07/23 0803 07/07/23 0900  BP:  128/80  Pulse: 66 71  Resp: 14 19  Temp:    SpO2: 100% 97%     General: Awake, interactive  CV:  Regular rate, good peripheral perfusion.  Resp:  Unlabored respirations Abd:  Nondistended, soft, nontender to palpation Neuro:  Alert and oriented, normal extraocular movements, symmetric facial movement, sensation intact over bilateral upper and lower extremities with 5 out of 5 strength.  Normal finger-to-nose testing.  ED Results / Procedures / Treatments   Labs (all labs ordered are listed, but only abnormal results are displayed) Labs Reviewed  CBC WITH DIFFERENTIAL/PLATELET - Abnormal; Notable for the following components:      Result Value   HCT 35.7 (*)    All other components within normal limits  COMPREHENSIVE METABOLIC PANEL WITH GFR - Abnormal; Notable for the following components:   Glucose, Bld 164 (*)    Creatinine, Ser 1.20 (*)    Calcium 8.8 (*)    GFR, Estimated 52 (*)    Anion gap  4 (*)    All other components within normal limits  T4, FREE - Abnormal; Notable for the following components:   Free T4 1.16 (*)    All other components within normal limits  TROPONIN I (HIGH SENSITIVITY) - Abnormal; Notable for the following components:   Troponin I (High Sensitivity) 63 (*)    All other components within normal limits  TSH  CK  TROPONIN I (HIGH SENSITIVITY)     EKG EKG independently reviewed interpreted by myself (ER attending) demonstrates:  EKG demonstrates sinus rhythm rate of 58, PR 147, QRS 94, QTc 433, no acute ST changes  RADIOLOGY Imaging independently reviewed and interpreted by myself demonstrates:  CT head without acute bleed  Formal Radiology Read:  CT Head Wo Contrast Result Date: 07/07/2023 CLINICAL DATA:  Syncope/presyncope, cerebrovascular cause suspected. EXAM: CT HEAD WITHOUT CONTRAST TECHNIQUE: Contiguous axial  images were obtained from the base of the skull through the vertex without intravenous contrast. RADIATION DOSE REDUCTION: This exam was performed according to the departmental dose-optimization program which includes automated exposure control, adjustment of the mA and/or kV according to patient size and/or use of iterative reconstruction technique. COMPARISON:  Head MRI 01/21/2018 FINDINGS: Brain: There is no evidence of an acute infarct, intracranial hemorrhage, mass, midline shift, or extra-axial fluid collection. Cerebral volume is normal. The ventricles are normal in size. Vascular: No hyperdense vessel. Skull: No fracture or suspicious lesion. Sinuses/Orbits: Visualized paranasal sinuses and mastoid air cells are clear. Unremarkable orbits. Other: None. IMPRESSION: Negative head CT. Electronically Signed   By: Aundra Lee M.D.   On: 07/07/2023 08:25    PROCEDURES:  Critical Care performed: No  Procedures   MEDICATIONS ORDERED IN ED: Medications  ondansetron  (ZOFRAN ) injection 4 mg (4 mg Intravenous Given 07/07/23 0740)     IMPRESSION / MDM / ASSESSMENT AND PLAN / ED COURSE  I reviewed the triage vital signs and the nursing notes.  Differential diagnosis includes, but is not limited to, arrhythmia, anemia, electrolyte abnormality, thyroid  dysfunction, vasovagal syncope, epileptic or nonepileptic seizure, ACS  Patient's presentation is most consistent with acute presentation with potential threat to life or bodily function.  63 year old female presenting following a syncopal episode.  Stable vitals on presentation.  EKG without ischemic findings.  Will obtain labs, CT head to further evaluate.  9:36 AM Labs with overall reassuring CBC.  CMP with mild renal dysfunction.  Normal TSH, minimally elevated T4.  Normal CK, overall lower suspicion for epileptic activity.  Troponin did return elevated at 63.  Most recent prior in our system was from several years ago, but was within normal  limits.  With patient's clinical history of unprovoked syncope and subsequent nausea and diaphoresis, I am concerned about possible cardiac syncope.  I did discuss admission and patient is agreeable.  Will reach out to hospitalist team.  9:50 AM Case discussed with Dr. Daisey Dryer who will evaluate for anticipated admission.       FINAL CLINICAL IMPRESSION(S) / ED DIAGNOSES   Final diagnoses:  Syncope and collapse     Rx / DC Orders   ED Discharge Orders     None        Note:  This document was prepared using Dragon voice recognition software and may include unintentional dictation errors.   Claria Crofts, MD 07/07/23 1002

## 2023-07-07 NOTE — ED Notes (Signed)
 Pt reports improvement in nausea after antiemetic

## 2023-07-07 NOTE — Assessment & Plan Note (Signed)
 Will check EEG x 1 in setting of syncopal event with noted urinary incontinence

## 2023-07-07 NOTE — ED Notes (Signed)
 EEG now complete. Tech taking upstairs after pt is finished in restroom.

## 2023-07-07 NOTE — Assessment & Plan Note (Signed)
 Positive syncopal event at home with noted secondary nausea weakness, urinary incontinence associated with event Will check EEG to rule out seizure event CT head within normal limits Will check 2D echo Check orthostatics IV fluid hydration Monitor

## 2023-07-07 NOTE — Progress Notes (Signed)
 Eeg done

## 2023-07-08 DIAGNOSIS — R569 Unspecified convulsions: Secondary | ICD-10-CM | POA: Diagnosis not present

## 2023-07-08 DIAGNOSIS — I2489 Other forms of acute ischemic heart disease: Secondary | ICD-10-CM | POA: Diagnosis not present

## 2023-07-08 DIAGNOSIS — E785 Hyperlipidemia, unspecified: Secondary | ICD-10-CM | POA: Diagnosis not present

## 2023-07-08 DIAGNOSIS — I214 Non-ST elevation (NSTEMI) myocardial infarction: Secondary | ICD-10-CM | POA: Diagnosis not present

## 2023-07-08 DIAGNOSIS — R55 Syncope and collapse: Secondary | ICD-10-CM | POA: Diagnosis not present

## 2023-07-08 LAB — COMPREHENSIVE METABOLIC PANEL WITH GFR
ALT: 24 U/L (ref 0–44)
AST: 22 U/L (ref 15–41)
Albumin: 3.4 g/dL — ABNORMAL LOW (ref 3.5–5.0)
Alkaline Phosphatase: 76 U/L (ref 38–126)
Anion gap: 8 (ref 5–15)
BUN: 14 mg/dL (ref 8–23)
CO2: 25 mmol/L (ref 22–32)
Calcium: 8.8 mg/dL — ABNORMAL LOW (ref 8.9–10.3)
Chloride: 107 mmol/L (ref 98–111)
Creatinine, Ser: 0.98 mg/dL (ref 0.44–1.00)
GFR, Estimated: >60 mL/min (ref >60)
Glucose, Bld: 116 mg/dL — ABNORMAL HIGH (ref 70–99)
Potassium: 3.7 mmol/L (ref 3.5–5.1)
Sodium: 140 mmol/L (ref 135–145)
Total Bilirubin: 0.6 mg/dL (ref 0.0–1.2)
Total Protein: 6.4 g/dL — ABNORMAL LOW (ref 6.5–8.1)

## 2023-07-08 LAB — ECHOCARDIOGRAM COMPLETE
Area-P 1/2: 4.79 cm2
Height: 63 in
S' Lateral: 2.8 cm
Weight: 2208 [oz_av]

## 2023-07-08 LAB — GLUCOSE, CAPILLARY: Glucose-Capillary: 114 mg/dL — ABNORMAL HIGH (ref 70–99)

## 2023-07-08 NOTE — Discharge Summary (Signed)
 Physician Discharge Summary   Patient: Margaret [name] MRN: [id number] DOB: 1960-11-28  Admit date:     07/07/2023  Discharge date: 07/08/23  Discharge Physician: Jodeane Mulligan   PCP: Little Riff, MD   Recommendations at discharge:    Pt to be discharged home.   If you experience worsening fever, chills, chest pain, shortness of breath, or other concerning symptoms, please call your PCP or go to the emergency department immediately.  Discharge Diagnoses: Principal Problem:   Syncope Active Problems:   Atypical absence seizure (HCC)   NSTEMI (non-ST elevated myocardial infarction) (HCC)  Resolved Problems:   * No resolved hospital problems. *   Hospital Course: Margaret [name] is a 63 y.o. female with medical history significant of anxiety, epilepsy, primary biliary cholangitis, hypothyroidism presenting with syncope, mild NSTEMI.  Patient reports sitting at home when she suddenly began to blackout.  Patient reports waking up on the floor having urinated herself.  Episode was unwitnessed.  Patient denies any weakness or dizziness prior to the event.  After waking patient reports significant nausea, diaphoresis and malaise.  Per the patient, nausea diaphoresis and malaise persisted for multiple hours even after calling EMS.  Patient denies any prior episodes like this in the past.  Does have remote history of?  Seizure disorder.  Was on brief course of antiepileptics and then stopped.  Denies any tobacco or alcohol use.  P.o. intake has been stable.  No chest pain or shortness of breath.  No dysuria. Presented to the ER afebrile, hemodynamically stable.  Satting well on room air.  White count 8.5, hemoglobin 12, platelets 190.  Troponin 63-65.  Creatinine 1.2.  Glucose 164.  TSH 1.16.  CT head within normal limits.  Assessment and Plan: Syncope -Positive syncopal event at home with noted secondary nausea weakness, urinary incontinence associated with event.  Likely seizure.  EEG  performed was negative.  Cardiac enzymes minimally elevated but flat.  Nothing observed on telemetry.  Evaluated by cardiology.  Plan for Holter monitor upon discharge.  Follow-up with cardiology in 2 weeks.   NSTEMI (non-ST elevated myocardial infarction) -Troponin 60s on presentation in the setting of syncope with nonspecific nausea and diaphoresis-initial concern for anginal equivalent.  Likely just demand ischemia.  Echo showing no abnormalities.  Nothing observed on telemetry.  Follow-up with cardiology in 2 weeks.  Primary biliary cholangitis - Stable.  LFTs normal.  Resume home medication regiment.  Patient follows with [facility] in the outpatient setting.            Consultants: Cardiology, neurology Procedures performed: EEG Disposition: Home Diet recommendation:  Discharge Diet Orders (From admission, onward)     Start     Ordered   07/08/23 0000  Diet - low sodium heart healthy        07/08/23 1148           Carb modified diet  DISCHARGE MEDICATION: Allergies as of 07/08/2023       Reactions   Keppra  [levetiracetam ]    Lexapro  [escitalopram  Oxalate] Nausea Only        Medication List     STOP taking these medications    loratadine 10 MG tablet Commonly known as: CLARITIN   Multi-Vitamins Tabs       TAKE these medications    hydrOXYzine  10 MG tablet Commonly known as: ATARAX  Take 1-2 tablets (10-20 mg total) by mouth daily as needed for anxiety.   levothyroxine  88 MCG tablet Commonly known as:  SYNTHROID  TAKE 1 TABLET(88 MCG) BY MOUTH EVERY DAY 30 TO 60 MINUTES BEFORE BREAKFAST ON AN EMPTY STOMACH AND WITH A GLASS OF WATER    Livdelzi 10 MG Caps Generic drug: Seladelpar Lysine Take 10 mg by mouth daily.   LORazepam  0.5 MG tablet Commonly known as: Ativan  Take 1-2 tablets (0.5-1 mg total) by mouth as directed. Take 1-2 tablets as needed 20 minutes prior to your dental procedure for anxiety   traZODone  50 MG tablet Commonly known  as: DESYREL  TAKE 1/2 TO 1 TABLET(25 TO 50 MG) BY MOUTH AT BEDTIME AS NEEDED FOR SLEEP   ursodiol  300 MG capsule Commonly known as: ACTIGALL  Take 1 capsule (300 mg total) by mouth 3 (three) times daily. With food        Follow-up Information     Alluri, Odessa Bene, MD. Go in 1 week(s).   Specialty: Cardiology Contact information: [address] [place] [address] [phone]                 Discharge Exam: Margaret [name] Weights   07/07/23 1308 07/08/23 0324  Weight: 62.6 kg 62.5 kg    GENERAL:  Alert, pleasant, no acute distress  HEENT:  EOMI CARDIOVASCULAR:  RRR, no murmurs appreciated RESPIRATORY:  Clear to auscultation, no wheezing, rales, or rhonchi GASTROINTESTINAL:  Soft, nontender, nondistended EXTREMITIES:  No LE edema bilaterally NEURO:  No new focal deficits appreciated SKIN:  No rashes noted PSYCH:  Appropriate mood and affect    Condition at discharge: improving  The results of significant diagnostics from this hospitalization (including imaging, microbiology, ancillary and laboratory) are listed below for reference.   Imaging Studies: ECHOCARDIOGRAM COMPLETE Result Date: 07/08/2023    ECHOCARDIOGRAM REPORT   Patient Name:   Margaret [name] Date of Exam: 07/07/2023 Medical Rec #:  [id number]       Height:       63.0 in Accession #:    [id number]      Weight:       138.0 lb Date of Birth:  March 21, 1960       BSA:          1.652 m Patient Age:    62 years        BP:           150/87 mmHg Patient Gender: F               HR:           75 bpm. Exam Location:  [facility] Procedure: 2D Echo, Cardiac Doppler and Color Doppler (Both Spectral and Color            Flow Doppler were utilized during procedure). Indications:     R55 Syncope  History:         Patient has prior history of Echocardiogram examinations, most                  recent 10/04/2016. Epilepsy. Hypothyroidism.  Sonographer:     Brigid Canada RDCS Referring Phys:  [id number] CARALYN HUDSON Diagnosing  Phys: Percival Brace MD IMPRESSIONS  1. Left ventricular ejection fraction, by estimation, is 60 to 65%. The left ventricle has normal function. The left ventricle has no regional wall motion abnormalities. Left ventricular diastolic parameters were normal.  2. Right ventricular systolic function is normal. The right ventricular size is normal.  3. The mitral valve is normal in structure. Mild mitral valve regurgitation. No evidence of mitral stenosis.  4. The aortic valve is normal in  structure. Aortic valve regurgitation is not visualized. No aortic stenosis is present.  5. The inferior vena cava is normal in size with greater than 50% respiratory variability, suggesting right atrial pressure of 3 mmHg. FINDINGS  Left Ventricle: Left ventricular ejection fraction, by estimation, is 60 to 65%. The left ventricle has normal function. The left ventricle has no regional wall motion abnormalities. Strain was performed and the global longitudinal strain is indeterminate. The left ventricular internal cavity size was normal in size. There is no left ventricular hypertrophy. Left ventricular diastolic parameters were normal. Right Ventricle: The right ventricular size is normal. No increase in right ventricular wall thickness. Right ventricular systolic function is normal. Left Atrium: Left atrial size was normal in size. Right Atrium: Right atrial size was normal in size. Pericardium: There is no evidence of pericardial effusion. Mitral Valve: The mitral valve is normal in structure. Mild mitral valve regurgitation. No evidence of mitral valve stenosis. Tricuspid Valve: The tricuspid valve is normal in structure. Tricuspid valve regurgitation is mild . No evidence of tricuspid stenosis. Aortic Valve: The aortic valve is normal in structure. Aortic valve regurgitation is not visualized. No aortic stenosis is present. Pulmonic Valve: The pulmonic valve was normal in structure. Pulmonic valve regurgitation is not  visualized. No evidence of pulmonic stenosis. Aorta: The aortic root is normal in size and structure. Venous: The inferior vena cava is normal in size with greater than 50% respiratory variability, suggesting right atrial pressure of 3 mmHg. IAS/Shunts: No atrial level shunt detected by color flow Doppler. Additional Comments: 3D was performed not requiring image post processing on an independent workstation and was indeterminate.  LEFT VENTRICLE PLAX 2D LVIDd:         4.30 cm   Diastology LVIDs:         2.80 cm   LV e' medial:    10.73 cm/s LV PW:         0.80 cm   LV E/e' medial:  9.2 LV IVS:        0.80 cm   LV e' lateral:   9.61 cm/s LVOT diam:     1.70 cm   LV E/e' lateral: 10.3 LV SV:         49 LV SV Index:   30 LVOT Area:     2.27 cm  RIGHT VENTRICLE             IVC RV Basal diam:  3.30 cm     IVC diam: 1.60 cm RV S prime:     13.70 cm/s TAPSE (M-mode): 2.6 cm LEFT ATRIUM             Index        RIGHT ATRIUM           Index LA diam:        3.80 cm 2.30 cm/m   RA Area:     12.40 cm LA Vol (A2C):   41.4 ml 25.07 ml/m  RA Volume:   30.40 ml  18.41 ml/m LA Vol (A4C):   41.9 ml 25.37 ml/m LA Biplane Vol: 42.5 ml 25.73 ml/m  AORTIC VALVE LVOT Vmax:   103.50 cm/s LVOT Vmean:  69.050 cm/s LVOT VTI:    0.217 m  AORTA Ao Root diam: 2.90 cm Ao Asc diam:  2.70 cm MITRAL VALVE               TRICUSPID VALVE MV Area (PHT): 4.79 cm    TR Peak grad:  20.8 mmHg MV Decel Time: 159 msec    TR Vmax:        228.00 cm/s MV E velocity: 98.85 cm/s MV A velocity: 75.75 cm/s  SHUNTS MV E/A ratio:  1.30        Systemic VTI:  0.22 m                            Systemic Diam: 1.70 cm Percival Brace MD Electronically signed by Percival Brace MD Signature Date/Time: 07/08/2023/8:50:42 AM    Final    EEG adult Result Date: 07/08/2023 Arleene Lack, MD     07/08/2023  5:53 AM Patient Name: ANQUENETTE ORMAN MRN: [id number] Epilepsy Attending: Arleene Lack Referring Physician/Provider: Corrinne Din, MD Date:  07/07/2023 Duration: 23.49 mins Patient history: 63yo F with syncope. EEG to evaluate for seizure Level of alertness: Awake AEDs during EEG study: None Technical aspects: This EEG study was done with scalp electrodes positioned according to the 10-20 International system of electrode placement. Electrical activity was reviewed with band pass filter of 1-70Hz , sensitivity of 7 uV/mm, display speed of 42mm/sec with a 60Hz  notched filter applied as appropriate. EEG data were recorded continuously and digitally stored.  Video monitoring was available and reviewed as appropriate. Description: The posterior dominant rhythm consists of 8.5 Hz activity of moderate voltage (25-35 uV) seen predominantly in posterior head regions, symmetric and reactive to eye opening and eye closing. Hyperventilation and photic stimulation were not performed.   IMPRESSION: This study is within normal limits. No seizures or epileptiform discharges were seen throughout the recording. A normal interictal EEG does not exclude the diagnosis of epilepsy. Arleene Lack   DG Chest Port 1 View Result Date: 07/07/2023 CLINICAL DATA:  Non ST elevated myocardial infarction. EXAM: PORTABLE CHEST 1 VIEW COMPARISON:  September 05, 2019. FINDINGS: The heart size and mediastinal contours are within normal limits. Both lungs are clear. The visualized skeletal structures are unremarkable. IMPRESSION: No active disease. Electronically Signed   By: Rosalene Colon M.D.   On: 07/07/2023 12:53   CT Head Wo Contrast Result Date: 07/07/2023 CLINICAL DATA:  Syncope/presyncope, cerebrovascular cause suspected. EXAM: CT HEAD WITHOUT CONTRAST TECHNIQUE: Contiguous axial images were obtained from the base of the skull through the vertex without intravenous contrast. RADIATION DOSE REDUCTION: This exam was performed according to the departmental dose-optimization program which includes automated exposure control, adjustment of the mA and/or kV according to patient size  and/or use of iterative reconstruction technique. COMPARISON:  Head MRI 01/21/2018 FINDINGS: Brain: There is no evidence of an acute infarct, intracranial hemorrhage, mass, midline shift, or extra-axial fluid collection. Cerebral volume is normal. The ventricles are normal in size. Vascular: No hyperdense vessel. Skull: No fracture or suspicious lesion. Sinuses/Orbits: Visualized paranasal sinuses and mastoid air cells are clear. Unremarkable orbits. Other: None. IMPRESSION: Negative head CT. Electronically Signed   By: Aundra Lee M.D.   On: 07/07/2023 08:25    Microbiology: No results found for this or any previous visit.  Labs: CBC: Recent Labs  Lab 07/07/23 0733  WBC 8.5  NEUTROABS 5.7  HGB 12.0  HCT 35.7*  MCV 91.1  PLT 190   Basic Metabolic Panel: Recent Labs  Lab 07/07/23 0733 07/08/23 0531  NA 135 140  K 3.8 3.7  CL 107 107  CO2 24 25  GLUCOSE 164* 116*  BUN 18 14  CREATININE 1.20* 0.98  CALCIUM 8.8* 8.8*  Liver Function Tests: Recent Labs  Lab 07/07/23 0733 07/08/23 0531  AST 23 22  ALT 26 24  ALKPHOS 81 76  BILITOT 0.6 0.6  PROT 7.1 6.4*  ALBUMIN 3.7 3.4*   CBG: Recent Labs  Lab 07/08/23 0513  GLUCAP 114*    Discharge time spent: 26 minutes.  Signed: Jodeane Mulligan, DO [facility] 07/08/2023

## 2023-07-08 NOTE — Progress Notes (Signed)
 [facility] CLINIC CARDIOLOGY PROGRESS NOTE       Patient ID: Margaret [name] MRN: [id number] DOB/AGE: [birth year] 63 y.o.  Admit date: 07/07/2023 Referring Physician Dr. Pablo Boards Primary Physician Little Riff, MD  Primary Cardiologist None Reason for Consultation syncope  HPI: Margaret [name] is a 63 y.o. female  with a past medical history of hyperlipidemia (cannot take statins per PCP due to [facility]), hypothyroidism, primary biliary cirrhosis, epilepsy, no known other cardiac history who presented to the ED on 07/07/2023 for a syncopal episode. Cardiology was consulted for further evaluation.   Interval history: -Patient seen and examined this morning, resting comfortably in hospital bed with husband present at bedside. -States that she is feeling well overall today, denies any recurrence of dizziness, diaphoresis, syncope. -Also denies any chest pain or shortness of breath. -Reviewed results of echocardiogram with patient and husband.  Review of systems complete and found to be negative unless listed above    Past Medical History:  Diagnosis Date   Anxiety    Epilepsy (HCC)    Hypothyroidism    Ovarian cyst     Past Surgical History:  Procedure Laterality Date   BREAST BIOPSY Left 2012   NEG   BREAST CYST ASPIRATION Left 2011   COLONOSCOPY WITH PROPOFOL  N/A 07/04/2022   Procedure: COLONOSCOPY WITH PROPOFOL ;  Surgeon: Marnee Sink, MD;  Location: [facility];  Service: Endoscopy;  Laterality: N/A;   OVARY SURGERY      Medications Prior to Admission  Medication Sig Dispense Refill Last Dose/Taking   hydrOXYzine  (ATARAX ) 10 MG tablet Take 1-2 tablets (10-20 mg total) by mouth daily as needed for anxiety. 60 tablet 0 Taking As Needed   levothyroxine  (SYNTHROID ) 88 MCG tablet TAKE 1 TABLET(88 MCG) BY MOUTH EVERY DAY 30 TO 60 MINUTES BEFORE BREAKFAST ON AN EMPTY STOMACH AND WITH A GLASS OF WATER    07/07/2023 Morning   loratadine (CLARITIN) 10 MG tablet Take 10 mg by  mouth daily as needed for allergies.   Taking As Needed   Multiple Vitamin (MULTI-VITAMINS) TABS Take 1 tablet by mouth daily.   07/06/2023   Seladelpar Lysine (LIVDELZI) 10 MG CAPS Take 10 mg by mouth daily. 90 capsule 3 07/06/2023   traZODone  (DESYREL ) 50 MG tablet TAKE 1/2 TO 1 TABLET(25 TO 50 MG) BY MOUTH AT BEDTIME AS NEEDED FOR SLEEP 90 tablet 1 07/06/2023 Bedtime   ursodiol  (ACTIGALL ) 300 MG capsule Take 1 capsule (300 mg total) by mouth 3 (three) times daily. With food 90 capsule 5 07/06/2023 Evening   LORazepam  (ATIVAN ) 0.5 MG tablet Take 1-2 tablets (0.5-1 mg total) by mouth as directed. Take 1-2 tablets as needed 20 minutes prior to your dental procedure for anxiety (Patient not taking: Reported on 05/25/2023) 3 tablet 0 Not Taking   Social History   Socioeconomic History   Marital status: Married    Spouse name: hubert   Number of children: 2   Years of education: Not on file   Highest education level: High school graduate  Occupational History    Comment: part time  Tobacco Use   Smoking status: Former    Current packs/day: 0.00    Types: Cigarettes    Quit date: 03/06/2011    Years since quitting: 12.3   Smokeless tobacco: Never  Vaping Use   Vaping status: Never Used  Substance and Sexual Activity   Alcohol use: Yes    Alcohol/week: 8.0 standard drinks of alcohol    Types: 4 Glasses of  wine, 4 Shots of liquor per week   Drug use: No   Sexual activity: Yes    Birth control/protection: None  Other Topics Concern   Not on file  Social History Narrative   Not on file   Social Drivers of Health   Financial Resource Strain: Low Risk  (03/05/2017)   Overall Financial Resource Strain (CARDIA)    Difficulty of Paying Living Expenses: Not hard at all  Food Insecurity: No Food Insecurity (07/07/2023)   Hunger Vital Sign    Worried About Running Out of Food in the Last Year: Never true    Ran Out of Food in the Last Year: Never true  Transportation Needs: No Transportation  Needs (07/07/2023)   PRAPARE - Administrator, Civil Service (Medical): No    Lack of Transportation (Non-Medical): No  Physical Activity: Sufficiently Active (03/05/2017)   Exercise Vital Sign    Days of Exercise per Week: 7 days    Minutes of Exercise per Session: 30 min  Stress: No Stress Concern Present (03/05/2017)   [employer] of Occupational Health - Occupational Stress Questionnaire    Feeling of Stress : Not at all  Social Connections: Moderately Isolated (03/05/2017)   Social Connection and Isolation Panel [NHANES]    Frequency of Communication with Friends and Family: Never    Frequency of Social Gatherings with Friends and Family: Never    Attends Religious Services: Never    Database administrator or Organizations: No    Attends Banker Meetings: Never    Marital Status: Married  Catering manager Violence: Not At Risk (07/07/2023)   Humiliation, Afraid, Rape, and Kick questionnaire    Fear of Current or Ex-Partner: No    Emotionally Abused: No    Physically Abused: No    Sexually Abused: No    Family History  Problem Relation Age of Onset   Diabetes Mother    CAD Father    Breast cancer Neg Hx      Vitals:   07/08/23 0010 07/08/23 0324 07/08/23 0514 07/08/23 0741  BP: 106/80  115/71 120/79  Pulse: (!) 57  67 65  Resp: 16   16  Temp:   98.3 F (36.8 C) 98 F (36.7 C)  TempSrc:   Oral Oral  SpO2: 100%  99% 100%  Weight:  62.5 kg    Height:        PHYSICAL EXAM General: Well appearing female, well nourished, in no acute distress. HEENT: Normocephalic and atraumatic. Neck: No JVD.  Lungs: Normal respiratory effort on room air. Clear bilaterally to auscultation. No wheezes, crackles, rhonchi.  Heart: HRRR. Normal S1 and S2 without gallops or murmurs.  Abdomen: Non-distended appearing.  Msk: Normal strength and tone for age. Extremities: Warm and well perfused. No clubbing, cyanosis. No edema.  Neuro: Alert and oriented X  3. Psych: Answers questions appropriately.   Labs: Basic Metabolic Panel: Recent Labs    07/07/23 0733 07/08/23 0531  NA 135 140  K 3.8 3.7  CL 107 107  CO2 24 25  GLUCOSE 164* 116*  BUN 18 14  CREATININE 1.20* 0.98  CALCIUM 8.8* 8.8*   Liver Function Tests: Recent Labs    07/07/23 0733 07/08/23 0531  AST 23 22  ALT 26 24  ALKPHOS 81 76  BILITOT 0.6 0.6  PROT 7.1 6.4*  ALBUMIN 3.7 3.4*   No results for input(s): "LIPASE", "AMYLASE" in the last 72 hours. CBC: Recent Labs  07/07/23 0733  WBC 8.5  NEUTROABS 5.7  HGB 12.0  HCT 35.7*  MCV 91.1  PLT 190   Cardiac Enzymes: Recent Labs    07/07/23 0733 07/07/23 0923  CKTOTAL 70  --   TROPONINIHS 63* 65*   BNP: No results for input(s): "BNP" in the last 72 hours. D-Dimer: No results for input(s): "DDIMER" in the last 72 hours. Hemoglobin A1C: Recent Labs    07/07/23 1155  HGBA1C 5.4   Fasting Lipid Panel: Recent Labs    07/07/23 1155  CHOL 209*  HDL 65  LDLCALC 132*  TRIG 59  CHOLHDL 3.2   Thyroid  Function Tests: Recent Labs    07/07/23 0733  TSH 1.833   Anemia Panel: No results for input(s): "VITAMINB12", "FOLATE", "FERRITIN", "TIBC", "IRON", "RETICCTPCT" in the last 72 hours.   Radiology: ECHOCARDIOGRAM COMPLETE Result Date: 07/08/2023    ECHOCARDIOGRAM REPORT   Patient Name:   Margaret [name] Date of Exam: 07/07/2023 Medical Rec #:  [id number]       Height:       63.0 in Accession #:    [id number]      Weight:       138.0 lb Date of Birth:  [birth year]       BSA:          1.652 m Patient Age:    62 years        BP:           150/87 mmHg Patient Gender: F               HR:           75 bpm. Exam Location:  [facility] Procedure: 2D Echo, Cardiac Doppler and Color Doppler (Both Spectral and Color            Flow Doppler were utilized during procedure). Indications:     R55 Syncope  History:         Patient has prior history of Echocardiogram examinations, most                  recent 10/04/2016.  Epilepsy. Hypothyroidism.  Sonographer:     Brigid Canada RDCS Referring Phys:  [id number] Jerami Tammen Diagnosing Phys: Percival Brace MD IMPRESSIONS  1. Left ventricular ejection fraction, by estimation, is 60 to 65%. The left ventricle has normal function. The left ventricle has no regional wall motion abnormalities. Left ventricular diastolic parameters were normal.  2. Right ventricular systolic function is normal. The right ventricular size is normal.  3. The mitral valve is normal in structure. Mild mitral valve regurgitation. No evidence of mitral stenosis.  4. The aortic valve is normal in structure. Aortic valve regurgitation is not visualized. No aortic stenosis is present.  5. The inferior vena cava is normal in size with greater than 50% respiratory variability, suggesting right atrial pressure of 3 mmHg. FINDINGS  Left Ventricle: Left ventricular ejection fraction, by estimation, is 60 to 65%. The left ventricle has normal function. The left ventricle has no regional wall motion abnormalities. Strain was performed and the global longitudinal strain is indeterminate. The left ventricular internal cavity size was normal in size. There is no left ventricular hypertrophy. Left ventricular diastolic parameters were normal. Right Ventricle: The right ventricular size is normal. No increase in right ventricular wall thickness. Right ventricular systolic function is normal. Left Atrium: Left atrial size was normal in size. Right Atrium: Right atrial size was normal in size. Pericardium: There is no evidence  of pericardial effusion. Mitral Valve: The mitral valve is normal in structure. Mild mitral valve regurgitation. No evidence of mitral valve stenosis. Tricuspid Valve: The tricuspid valve is normal in structure. Tricuspid valve regurgitation is mild . No evidence of tricuspid stenosis. Aortic Valve: The aortic valve is normal in structure. Aortic valve regurgitation is not visualized. No aortic  stenosis is present. Pulmonic Valve: The pulmonic valve was normal in structure. Pulmonic valve regurgitation is not visualized. No evidence of pulmonic stenosis. Aorta: The aortic root is normal in size and structure. Venous: The inferior vena cava is normal in size with greater than 50% respiratory variability, suggesting right atrial pressure of 3 mmHg. IAS/Shunts: No atrial level shunt detected by color flow Doppler. Additional Comments: 3D was performed not requiring image post processing on an independent workstation and was indeterminate.  LEFT VENTRICLE PLAX 2D LVIDd:         4.30 cm   Diastology LVIDs:         2.80 cm   LV e' medial:    10.73 cm/s LV PW:         0.80 cm   LV E/e' medial:  9.2 LV IVS:        0.80 cm   LV e' lateral:   9.61 cm/s LVOT diam:     1.70 cm   LV E/e' lateral: 10.3 LV SV:         49 LV SV Index:   30 LVOT Area:     2.27 cm  RIGHT VENTRICLE             IVC RV Basal diam:  3.30 cm     IVC diam: 1.60 cm RV S prime:     13.70 cm/s TAPSE (M-mode): 2.6 cm LEFT ATRIUM             Index        RIGHT ATRIUM           Index LA diam:        3.80 cm 2.30 cm/m   RA Area:     12.40 cm LA Vol (A2C):   41.4 ml 25.07 ml/m  RA Volume:   30.40 ml  18.41 ml/m LA Vol (A4C):   41.9 ml 25.37 ml/m LA Biplane Vol: 42.5 ml 25.73 ml/m  AORTIC VALVE LVOT Vmax:   103.50 cm/s LVOT Vmean:  69.050 cm/s LVOT VTI:    0.217 m  AORTA Ao Root diam: 2.90 cm Ao Asc diam:  2.70 cm MITRAL VALVE               TRICUSPID VALVE MV Area (PHT): 4.79 cm    TR Peak grad:   20.8 mmHg MV Decel Time: 159 msec    TR Vmax:        228.00 cm/s MV E velocity: 98.85 cm/s MV A velocity: 75.75 cm/s  SHUNTS MV E/A ratio:  1.30        Systemic VTI:  0.22 m                            Systemic Diam: 1.70 cm Percival Brace MD Electronically signed by Percival Brace MD Signature Date/Time: 07/08/2023/8:50:42 AM    Final    EEG adult Result Date: 07/08/2023 Arleene Lack, MD     07/08/2023  5:53 AM Patient Name: Margaret [name] MRN: [id number] Epilepsy Attending: Arleene Lack Referring Physician/Provider: Corrinne Din, MD Date: 07/07/2023  Duration: 23.49 mins Patient history: 63yo F with syncope. EEG to evaluate for seizure Level of alertness: Awake AEDs during EEG study: None Technical aspects: This EEG study was done with scalp electrodes positioned according to the 10-20 International system of electrode placement. Electrical activity was reviewed with band pass filter of 1-70Hz , sensitivity of 7 uV/mm, display speed of 17mm/sec with a 60Hz  notched filter applied as appropriate. EEG data were recorded continuously and digitally stored.  Video monitoring was available and reviewed as appropriate. Description: The posterior dominant rhythm consists of 8.5 Hz activity of moderate voltage (25-35 uV) seen predominantly in posterior head regions, symmetric and reactive to eye opening and eye closing. Hyperventilation and photic stimulation were not performed.   IMPRESSION: This study is within normal limits. No seizures or epileptiform discharges were seen throughout the recording. A normal interictal EEG does not exclude the diagnosis of epilepsy. Arleene Lack   DG Chest Port 1 View Result Date: 07/07/2023 CLINICAL DATA:  Non ST elevated myocardial infarction. EXAM: PORTABLE CHEST 1 VIEW COMPARISON:  September 05, 2019. FINDINGS: The heart size and mediastinal contours are within normal limits. Both lungs are clear. The visualized skeletal structures are unremarkable. IMPRESSION: No active disease. Electronically Signed   By: Rosalene Colon M.D.   On: 07/07/2023 12:53   CT Head Wo Contrast Result Date: 07/07/2023 CLINICAL DATA:  Syncope/presyncope, cerebrovascular cause suspected. EXAM: CT HEAD WITHOUT CONTRAST TECHNIQUE: Contiguous axial images were obtained from the base of the skull through the vertex without intravenous contrast. RADIATION DOSE REDUCTION: This exam was performed according to the departmental  dose-optimization program which includes automated exposure control, adjustment of the mA and/or kV according to patient size and/or use of iterative reconstruction technique. COMPARISON:  Head MRI 01/21/2018 FINDINGS: Brain: There is no evidence of an acute infarct, intracranial hemorrhage, mass, midline shift, or extra-axial fluid collection. Cerebral volume is normal. The ventricles are normal in size. Vascular: No hyperdense vessel. Skull: No fracture or suspicious lesion. Sinuses/Orbits: Visualized paranasal sinuses and mastoid air cells are clear. Unremarkable orbits. Other: None. IMPRESSION: Negative head CT. Electronically Signed   By: Aundra Lee M.D.   On: 07/07/2023 08:25    ECHO as above  TELEMETRY reviewed by me 07/08/2023: sinus rhythm, rate 80s  EKG reviewed by me: sinus bradycardia, rate 58 bpm with no acute ischemic changes  Data reviewed by me 07/08/2023: last 24h vitals tele labs imaging I/O hospitalist progress note  Principal Problem:   Syncope Active Problems:   Atypical absence seizure (HCC)   NSTEMI (non-ST elevated myocardial infarction) (HCC)    ASSESSMENT AND PLAN:  Margaret [name] is a 63 y.o. female  with a past medical history of hyperlipidemia (cannot take statins per PCP due to [facility]), hypothyroidism, primary biliary cirrhosis, epilepsy, no known other cardiac history who presented to the ED on 07/07/2023 for a syncopal episode. Cardiology was consulted for further evaluation.   # Syncope # Demand Ischemia # Hyperlipidemia Patient with history of epilepsy and no known cardiac history reported to the ED due to an witnessed syncopal episode. Patient denies any chest pain or SOB before or after the event. Trops mildly elevated and flat 63 > 65. EKG in ED sinus bradycardia with no sign of AVB or acute ischemic changes. BP and HR are stable this afternoon.  Echo this admission with EF 60-65%, normal diastolic function, mild MR. -Plan to place holter monitor on patient  prior to discharge to further evaluate syncopal episode outpatient. -  Mildly elevated and flat troponins most consistent with demand/supply mismatch and not ACS.  -No plan for further cardiac diagnostics at this time.  -DC statin as patient has been told not to take this in the past by hepatology due to her primary biliary cirrhosis.  Ok for discharge today from a cardiac perspective. Will arrange for follow up in clinic with Dr. Bob Burn in 1-2 weeks.    This patient's plan of care was discussed and created with Dr. Parks Bollman and he is in agreement.  Signed: Hamp Levine, PA-C  07/08/2023, 9:42 AM [facility]

## 2023-07-08 NOTE — Plan of Care (Signed)
  Problem: Education: Goal: Knowledge of condition and prescribed therapy will improve Outcome: Progressing   Problem: Cardiac: Goal: Will achieve and/or maintain adequate cardiac output Outcome: Progressing   Problem: Physical Regulation: Goal: Complications related to the disease process, condition or treatment will be avoided or minimized Outcome: Progressing   Problem: Activity: Goal: Risk for activity intolerance will decrease Outcome: Progressing   Problem: Nutrition: Goal: Adequate nutrition will be maintained Outcome: Progressing   Problem: Nutrition: Goal: Adequate nutrition will be maintained Outcome: Progressing   Problem: Coping: Goal: Level of anxiety will decrease Outcome: Progressing

## 2023-07-08 NOTE — Procedures (Signed)
 Patient Name: Margaret [name]  MRN: [id number]  Epilepsy Attending: Arleene Lack  Referring Physician/Provider: Corrinne Din, MD  Date: 07/07/2023 Duration: 23.49 mins  Patient history: 63yo F with syncope. EEG to evaluate for seizure  Level of alertness: Awake  AEDs during EEG study: None  Technical aspects: This EEG study was done with scalp electrodes positioned according to the 10-20 International system of electrode placement. Electrical activity was reviewed with band pass filter of 1-70Hz , sensitivity of 7 uV/mm, display speed of 62mm/sec with a 60Hz  notched filter applied as appropriate. EEG data were recorded continuously and digitally stored.  Video monitoring was available and reviewed as appropriate.  Description: The posterior dominant rhythm consists of 8.5 Hz activity of moderate voltage (25-35 uV) seen predominantly in posterior head regions, symmetric and reactive to eye opening and eye closing. Hyperventilation and photic stimulation were not performed.     IMPRESSION: This study is within normal limits. No seizures or epileptiform discharges were seen throughout the recording.  A normal interictal EEG does not exclude the diagnosis of epilepsy.  Margaret [name]

## 2023-07-08 NOTE — Evaluation (Signed)
 Physical Therapy Evaluation Patient Details Name: Margaret [name] MRN: [id number] DOB: 10/11/1960 Today's Date: 07/08/2023  History of Present Illness  Pt is a 63 y.o. female presenting with syncope, mild NSTEMI. CT head WNL. PMH of anxiety, epilepsy, primary biliary cholangitis, hypothyroidism.   Clinical Impression  Pt received in Semi-Fowler's position and agreeable to therapy.  Pt agreeable to evaluation by PT.  Pt has good strength in the LE's and was able to ambulate without any assistance necessary.  Pt then transitioned to performing stair training without any complications and was able to ascend/descend without need of UE support on rails.  Patient is at baseline, all education completed, and time is given to address all questions/concerns. No additional skilled PT services needed at this time, PT signing off. PT recommends daily ambulation ad lib or with nursing staff as needed to prevent deconditioning.          If plan is discharge home, recommend the following:     Can travel by private vehicle        Equipment Recommendations None recommended by PT  Recommendations for Other Services       Functional Status Assessment Patient has not had a recent decline in their functional status     Precautions / Restrictions Precautions Precautions: None Restrictions Weight Bearing Restrictions Per Provider Order: No      Mobility  Bed Mobility Overal bed mobility: Independent                  Transfers Overall transfer level: Independent Equipment used: None                    Ambulation/Gait Ambulation/Gait assistance: Supervision   Assistive device: None            Stairs            Wheelchair Mobility     Tilt Bed    Modified Rankin (Stroke Patients Only)       Balance Overall balance assessment: Independent                                           Pertinent Vitals/Pain Pain Assessment Pain Assessment:  No/denies pain    Home Living Family/patient expects to be discharged to:: Private residence Living Arrangements: Spouse/significant other Available Help at Discharge: Family;Available PRN/intermittently Type of Home: House Home Access: Stairs to enter Entrance Stairs-Rails: Can reach both;Right;Left Entrance Stairs-Number of Steps: 4 STE front and back; bil HR   Home Layout: One level Home Equipment: None      Prior Function Prior Level of Function : Independent/Modified Independent;Working/employed;Driving             Mobility Comments: IND, no falls, hikes 6 miles over the weekend ADLs Comments: IND, 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[employer], drives, grocery shops     Extremity/Trunk Assessment   Upper Extremity Assessment Upper Extremity Assessment: Overall WFL for tasks assessed    Lower Extremity Assessment Lower Extremity Assessment: Overall WFL for tasks assessed       Communication   Communication Communication: No apparent difficulties    Cognition Arousal: Alert Behavior During Therapy: [facility] for tasks assessed/performed                             Following commands: Intact  Cueing       General Comments General comments (skin integrity, edema, etc.): no dizziness, nausea or lightheadedness during mobility in ED hallway/bathroom    Exercises     Assessment/Plan    PT Assessment Patient does not need any further PT services  PT Problem List         PT Treatment Interventions      PT Goals (Current goals can be found in the Care Plan section)  Acute Rehab PT Goals Patient Stated Goal: to go home. PT Goal Formulation: With patient Time For Goal Achievement: 07/22/23 Potential to Achieve Goals: Good    Frequency       Co-evaluation               AM-PAC PT "6 Clicks" Mobility  Outcome Measure Help needed turning from your back to your side while in a flat bed without using bedrails?: None Help needed moving from  lying on your back to sitting on the side of a flat bed without using bedrails?: None Help needed moving to and from a bed to a chair (including a wheelchair)?: None Help needed standing up from a chair using your arms (e.g., wheelchair or bedside chair)?: None Help needed to walk in hospital room?: None Help needed climbing 3-5 steps with a railing? : None 6 Click Score: 24    End of Session   Activity Tolerance: Patient tolerated treatment well Patient left: in bed Nurse Communication: Mobility status PT Visit Diagnosis: Unsteadiness on feet (R26.81);Dizziness and giddiness (R42)    Time: 1610-9604 PT Time Calculation (min) (ACUTE ONLY): 4 min   Charges:   PT Evaluation $PT Eval Low Complexity: 1 Low   PT General Charges $$ ACUTE PT VISIT: 1 Visit         Rozanna Corner, PT, DPT Physical Therapist - [facility]  07/08/23, 12:32 PM

## 2023-07-09 DIAGNOSIS — K743 Primary biliary cirrhosis: Secondary | ICD-10-CM | POA: Diagnosis not present

## 2023-07-09 DIAGNOSIS — Z87891 Personal history of nicotine dependence: Secondary | ICD-10-CM | POA: Diagnosis not present

## 2023-07-09 DIAGNOSIS — Z23 Encounter for immunization: Secondary | ICD-10-CM | POA: Diagnosis not present

## 2023-07-09 DIAGNOSIS — R55 Syncope and collapse: Secondary | ICD-10-CM | POA: Diagnosis not present

## 2023-07-09 DIAGNOSIS — R7989 Other specified abnormal findings of blood chemistry: Secondary | ICD-10-CM | POA: Diagnosis not present

## 2023-07-16 DIAGNOSIS — R55 Syncope and collapse: Secondary | ICD-10-CM | POA: Diagnosis not present

## 2023-07-20 NOTE — H&P (View-Only) (Signed)
 Electrophysiology Office Note:   Date:  07/22/2023  ID:  Margaret [name], DOB Nov 25, 1960, MRN [id number]  Primary Cardiologist: None Electrophysiologist: Ardeen Kohler, MD      History of Present Illness:   Margaret [name] is a 63 y.o. female with h/o seizure disorder, primary biliary cholangitis who is being seen today for evaluation of sinus pauses.  Discussed the use of AI scribe software for clinical note transcription with the patient, who gave verbal consent to proceed.  History of Present Illness She has experienced four to five episodes of syncope, all occurring in the morning, around 9am. The first episode, which required an emergency room visit, occurred last Tuesday. During this episode, she felt a cold chill, followed by waking up feeling extremely hot and sweaty, and subsequently vomited. She was sitting down with her coffee and cat when the episode occurred. She was able to call her partner before vomiting and being taken to the hospital. She describes feeling the episodes coming on and sometimes can prevent them by crossing her legs. When unable to stop them, she 'just comes back to and is fine' with plenty of energy afterward. The episodes typically occur while she is sitting down, relaxed with her coffee, book, and cat, and have been happening once a week. There have been no warning signs or sensation of an impending episode. She has been able to engage in physical activities such as hiking without any issues. She has not been driving due to these episodes. Episodes are not related to positional changes. She has a history of epilepsy but has been off medication for it after not having episodes for a while. No recent heart fluttering, racing, or skipping. She mentions a ringing in her ears during one episode, which she managed to stop by crossing her legs. She is not currently on any medications that would affect her heart rate. Cardiac monitor was ordered and patient was found to have a  5.9s sinus pause.   Review of systems complete and found to be negative unless listed in HPI.   EP Information / Studies Reviewed:    EKG is ordered today. Personal review as below.  EKG Interpretation Date/Time:  Tuesday Jul 21 2023 11:12:42 EDT Ventricular Rate:  72 PR Interval:  126 QRS Duration:  82 QT Interval:  402 QTC Calculation: 440 R Axis:   88  Text Interpretation: Normal sinus rhythm Nonspecific ST abnormality When compared with ECG of 07-Jul-2023 07:26, No significant change since Confirmed by Ardeen Kohler [phone]) on 07/22/2023 9:10:54 AM   Echo 07/07/23:   1. Left ventricular ejection fraction, by estimation, is 60 to 65%. The  left ventricle has normal function. The left ventricle has no regional  wall motion abnormalities. Left ventricular diastolic parameters were  normal.   2. Right ventricular systolic function is normal. The right ventricular  size is normal.   3. The mitral valve is normal in structure. Mild mitral valve  regurgitation. No evidence of mitral stenosis.   4. The aortic valve is normal in structure. Aortic valve regurgitation is  not visualized. No aortic stenosis is present.   5. The inferior vena cava is normal in size with greater than 50%  respiratory variability, suggesting right atrial pressure of 3 mmHg.    7 day Holter monitor from 07/08/2023 to 07/09/2023  Indication: Syncope  Findings:  Predominant rhythm is sinus rhythm. Maximum heart rate 167 bpm, minimum heart rate 41 bpm the average heart rate of 79 bpm Rare PACs  and rare PVCs noted 2 occurrences of short lasting supraventricular tachycardia, longest episode for 8 beats. Sinus pause lasting for 5.9 seconds 0 patient triggers  Impression: Predominant rhythm is sinus rhythm. Sinus pause lasting for 5.9 seconds.       Physical Exam:   VS:  BP 135/80 (BP Location: Left Arm)   Pulse 72   Ht 5\' 3"  (1.6 m)   Wt 136 lb 6.4 oz (61.9 kg)   SpO2 98%   BMI 24.16 kg/m    Wt  Readings from Last 3 Encounters:  07/21/23 136 lb 6.4 oz (61.9 kg)  07/08/23 137 lb 12.6 oz (62.5 kg)  07/04/22 133 lb (60.3 kg)     GEN: Well nourished, well developed in no acute distress NECK: No JVD CARDIAC: Normal rate, regular rhythm RESPIRATORY:  Clear to auscultation without rales, wheezing or rhonchi  ABDOMEN: Soft, non-distended EXTREMITIES:  No edema; No deformity   ASSESSMENT AND PLAN:    #. Sinus pause/sinus node dysfunction: Likely in some way related to vagal input but no identifiable or reversible etiology can be found. There appears to be no orthostatic or positional component based on her history. Orthostatics were checked in clinic and were normal. Aside from pauses, she can increase her sinus rates appropriately and can exercise without issue.  #. Syncope: Likely secondary to sinus pauses.  - Due to recurrent symptomatic sinus node dysfunction/pauses without reversible cause or identifiable trigger, leading to recurrent syncope, patient would benefit from implantation of permanent pacemaker.  Will use Biotronik for CLS algorithm. - Syncope precautions provided. Encouraged adequate hydration and salt intake.  - She has no plans to resume driving at this time. We discussed not driving for 6 months in setting of syncope.   Follow up with Dr. Daneil Dunker 3 months after pacemaker implant.    Signed, Ardeen Kohler, MD

## 2023-07-20 NOTE — Progress Notes (Signed)
 Electrophysiology Office Note:   Date:  07/22/2023  ID:  EMALEY [name], DOB Nov 25, 1960, MRN [id number]  Primary Cardiologist: None Electrophysiologist: Ardeen Kohler, MD      History of Present Illness:   Margaret [name] is a 63 y.o. female with h/o seizure disorder, primary biliary cholangitis who is being seen today for evaluation of sinus pauses.  Discussed the use of AI scribe software for clinical note transcription with the patient, who gave verbal consent to proceed.  History of Present Illness She has experienced four to five episodes of syncope, all occurring in the morning, around 9am. The first episode, which required an emergency room visit, occurred last Tuesday. During this episode, she felt a cold chill, followed by waking up feeling extremely hot and sweaty, and subsequently vomited. She was sitting down with her coffee and cat when the episode occurred. She was able to call her partner before vomiting and being taken to the hospital. She describes feeling the episodes coming on and sometimes can prevent them by crossing her legs. When unable to stop them, she 'just comes back to and is fine' with plenty of energy afterward. The episodes typically occur while she is sitting down, relaxed with her coffee, book, and cat, and have been happening once a week. There have been no warning signs or sensation of an impending episode. She has been able to engage in physical activities such as hiking without any issues. She has not been driving due to these episodes. Episodes are not related to positional changes. She has a history of epilepsy but has been off medication for it after not having episodes for a while. No recent heart fluttering, racing, or skipping. She mentions a ringing in her ears during one episode, which she managed to stop by crossing her legs. She is not currently on any medications that would affect her heart rate. Cardiac monitor was ordered and patient was found to have a  5.9s sinus pause.   Review of systems complete and found to be negative unless listed in HPI.   EP Information / Studies Reviewed:    EKG is ordered today. Personal review as below.  EKG Interpretation Date/Time:  Tuesday Jul 21 2023 11:12:42 EDT Ventricular Rate:  72 PR Interval:  126 QRS Duration:  82 QT Interval:  402 QTC Calculation: 440 R Axis:   88  Text Interpretation: Normal sinus rhythm Nonspecific ST abnormality When compared with ECG of 07-Jul-2023 07:26, No significant change since Confirmed by Ardeen Kohler [phone]) on 07/22/2023 9:10:54 AM   Echo 07/07/23:   1. Left ventricular ejection fraction, by estimation, is 60 to 65%. The  left ventricle has normal function. The left ventricle has no regional  wall motion abnormalities. Left ventricular diastolic parameters were  normal.   2. Right ventricular systolic function is normal. The right ventricular  size is normal.   3. The mitral valve is normal in structure. Mild mitral valve  regurgitation. No evidence of mitral stenosis.   4. The aortic valve is normal in structure. Aortic valve regurgitation is  not visualized. No aortic stenosis is present.   5. The inferior vena cava is normal in size with greater than 50%  respiratory variability, suggesting right atrial pressure of 3 mmHg.    7 day Holter monitor from 07/08/2023 to 07/09/2023  Indication: Syncope  Findings:  Predominant rhythm is sinus rhythm. Maximum heart rate 167 bpm, minimum heart rate 41 bpm the average heart rate of 79 bpm Rare PACs  and rare PVCs noted 2 occurrences of short lasting supraventricular tachycardia, longest episode for 8 beats. Sinus pause lasting for 5.9 seconds 0 patient triggers  Impression: Predominant rhythm is sinus rhythm. Sinus pause lasting for 5.9 seconds.       Physical Exam:   VS:  BP 135/80 (BP Location: Left Arm)   Pulse 72   Ht 5\' 3"  (1.6 m)   Wt 136 lb 6.4 oz (61.9 kg)   SpO2 98%   BMI 24.16 kg/m    Wt  Readings from Last 3 Encounters:  07/21/23 136 lb 6.4 oz (61.9 kg)  07/08/23 137 lb 12.6 oz (62.5 kg)  07/04/22 133 lb (60.3 kg)     GEN: Well nourished, well developed in no acute distress NECK: No JVD CARDIAC: Normal rate, regular rhythm RESPIRATORY:  Clear to auscultation without rales, wheezing or rhonchi  ABDOMEN: Soft, non-distended EXTREMITIES:  No edema; No deformity   ASSESSMENT AND PLAN:    #. Sinus pause/sinus node dysfunction: Likely in some way related to vagal input but no identifiable or reversible etiology can be found. There appears to be no orthostatic or positional component based on her history. Orthostatics were checked in clinic and were normal. Aside from pauses, she can increase her sinus rates appropriately and can exercise without issue.  #. Syncope: Likely secondary to sinus pauses.  - Due to recurrent symptomatic sinus node dysfunction/pauses without reversible cause or identifiable trigger, leading to recurrent syncope, patient would benefit from implantation of permanent pacemaker.  Will use Biotronik for CLS algorithm. - Syncope precautions provided. Encouraged adequate hydration and salt intake.  - She has no plans to resume driving at this time. We discussed not driving for 6 months in setting of syncope.   Follow up with Dr. Daneil Dunker 3 months after pacemaker implant.    Signed, Ardeen Kohler, MD

## 2023-07-21 ENCOUNTER — Ambulatory Visit: Attending: Cardiology | Admitting: Cardiology

## 2023-07-21 VITALS — BP 135/80 | HR 72 | Ht 63.0 in | Wt 136.4 lb

## 2023-07-21 DIAGNOSIS — I214 Non-ST elevation (NSTEMI) myocardial infarction: Secondary | ICD-10-CM

## 2023-07-21 DIAGNOSIS — F419 Anxiety disorder, unspecified: Secondary | ICD-10-CM

## 2023-07-21 DIAGNOSIS — I455 Other specified heart block: Secondary | ICD-10-CM | POA: Diagnosis not present

## 2023-07-21 DIAGNOSIS — R55 Syncope and collapse: Secondary | ICD-10-CM | POA: Diagnosis not present

## 2023-07-21 DIAGNOSIS — K743 Primary biliary cirrhosis: Secondary | ICD-10-CM

## 2023-07-21 DIAGNOSIS — G40A09 Absence epileptic syndrome, not intractable, without status epilepticus: Secondary | ICD-10-CM

## 2023-07-21 NOTE — Patient Instructions (Signed)
 Medication Instructions:  Your physician recommends that you continue on your current medications as directed. Please refer to the Current Medication list given to you today.  *If you need a refill on your cardiac medications before your next appointment, please call your pharmacy*  Follow-Up: At [facility], you and your health needs are our priority.  As part of our continuing mission to provide you with exceptional heart care, our providers are all part of one team.  This team includes your primary Cardiologist (physician) and Advanced Practice Providers or APPs (Physician Assistants and Nurse Practitioners) who all work together to provide you with the care you need, when you need it.  Your next appointment:   We will call you to discuss next steps

## 2023-07-23 DIAGNOSIS — I455 Other specified heart block: Secondary | ICD-10-CM | POA: Diagnosis not present

## 2023-07-23 DIAGNOSIS — R55 Syncope and collapse: Secondary | ICD-10-CM | POA: Diagnosis not present

## 2023-07-30 ENCOUNTER — Telehealth (HOSPITAL_COMMUNITY): Payer: Self-pay

## 2023-07-30 NOTE — Telephone Encounter (Signed)
 Attempted to reach patient to discuss upcoming procedure, no answer. Left VM for patient to return call.

## 2023-08-05 NOTE — Telephone Encounter (Signed)
 Patient returned call to discuss upcoming procedure.   Confirmed patient is scheduled for a Permanent transvenous pacemaker (PPM) on Thursday, May 29 with Dr. Clinton Danas. Instructed patient to arrive at the Main Entrance A at [facility]: [address] [place], [place] [address] and check in at Admitting at 1:30 PM.   Labs completed  Any recent signs of acute illness or been started on antibiotics? No Any new medications started? No Any medications to hold? No Medication instructions:  On the morning of your procedure  No eating or drinking after midnight prior to procedure.   The night before your procedure and the morning of your procedure, wash thoroughly with the CHG surgical soap from the neck down, paying special attention to the area where your procedure will be performed.  Advised of plan to go home the same day and will only stay overnight if medically necessary. You MUST have a responsible adult to drive you home and MUST be with you the first 24 hours after you arrive home.  Patient verbalized understanding to all instructions provided and agreed to proceed with procedure.

## 2023-08-06 ENCOUNTER — Ambulatory Visit (HOSPITAL_COMMUNITY)
Admission: RE | Admit: 2023-08-06 | Discharge: 2023-08-06 | Disposition: A | Discharge status: Home / Self Care | Attending: Cardiology | Admitting: Cardiology

## 2023-08-06 ENCOUNTER — Other Ambulatory Visit: Payer: Self-pay

## 2023-08-06 ENCOUNTER — Encounter: Payer: Self-pay | Admitting: Emergency Medicine

## 2023-08-06 ENCOUNTER — Ambulatory Visit (HOSPITAL_COMMUNITY)
Admission: RE | Disposition: A | Discharge status: Home / Self Care | Payer: Self-pay | Source: Home / Self Care | Attending: Cardiology

## 2023-08-06 ENCOUNTER — Ambulatory Visit (HOSPITAL_COMMUNITY)

## 2023-08-06 ENCOUNTER — Encounter (HOSPITAL_COMMUNITY): Payer: Self-pay | Admitting: Cardiology

## 2023-08-06 DIAGNOSIS — K743 Primary biliary cirrhosis: Secondary | ICD-10-CM | POA: Diagnosis not present

## 2023-08-06 DIAGNOSIS — R001 Bradycardia, unspecified: Secondary | ICD-10-CM | POA: Insufficient documentation

## 2023-08-06 DIAGNOSIS — I495 Sick sinus syndrome: Secondary | ICD-10-CM | POA: Diagnosis not present

## 2023-08-06 DIAGNOSIS — Z950 Presence of cardiac pacemaker: Secondary | ICD-10-CM | POA: Diagnosis not present

## 2023-08-06 DIAGNOSIS — G40909 Epilepsy, unspecified, not intractable, without status epilepticus: Secondary | ICD-10-CM | POA: Insufficient documentation

## 2023-08-06 HISTORY — PX: PACEMAKER IMPLANT: EP1218

## 2023-08-06 SURGERY — PACEMAKER IMPLANT
Anesthesia: LOCAL

## 2023-08-06 MED ORDER — CEFAZOLIN SODIUM-DEXTROSE 2-4 GM/100ML-% IV SOLN: 2.0000 g | INTRAVENOUS | Status: DC

## 2023-08-06 MED ORDER — FENTANYL CITRATE (PF) 100 MCG/2ML IJ SOLN
INTRAMUSCULAR | Status: AC
  Filled 2023-08-06: qty 2

## 2023-08-06 MED ORDER — GENTAMICIN SULFATE 40 MG/ML IJ SOLN
INTRAMUSCULAR | Status: AC
  Filled 2023-08-06: qty 2

## 2023-08-06 MED ORDER — LIDOCAINE HCL (PF) 1 % IJ SOLN
INTRAMUSCULAR | Status: DC | PRN
  Administered 2023-08-06: 60 mL

## 2023-08-06 MED ORDER — MIDAZOLAM HCL 2 MG/2ML IJ SOLN
INTRAMUSCULAR | Status: AC
  Filled 2023-08-06: qty 2

## 2023-08-06 MED ORDER — CEFAZOLIN SODIUM-DEXTROSE 2-4 GM/100ML-% IV SOLN
INTRAVENOUS | Status: AC
  Filled 2023-08-06: qty 100

## 2023-08-06 MED ORDER — HEPARIN (PORCINE) IN NACL 1000-0.9 UT/500ML-% IV SOLN
INTRAVENOUS | Status: DC | PRN
  Administered 2023-08-06: 500 mL

## 2023-08-06 MED ORDER — MIDAZOLAM HCL 5 MG/5ML IJ SOLN
INTRAMUSCULAR | Status: DC | PRN
  Administered 2023-08-06: 1 mg via INTRAVENOUS
  Administered 2023-08-06: .5 mg via INTRAVENOUS
  Administered 2023-08-06: 1 mg via INTRAVENOUS
  Administered 2023-08-06: .5 mg via INTRAVENOUS
  Administered 2023-08-06: 1 mg via INTRAVENOUS

## 2023-08-06 MED ORDER — CEFAZOLIN SODIUM-DEXTROSE 2-3 GM-%(50ML) IV SOLR
INTRAVENOUS | Status: DC | PRN
  Administered 2023-08-06: 2 g via INTRAVENOUS

## 2023-08-06 MED ORDER — FENTANYL CITRATE (PF) 100 MCG/2ML IJ SOLN
INTRAMUSCULAR | Status: DC | PRN
  Administered 2023-08-06: 25 ug via INTRAVENOUS
  Administered 2023-08-06 (×3): 50 ug via INTRAVENOUS
  Administered 2023-08-06: 25 ug via INTRAVENOUS

## 2023-08-06 MED ORDER — SODIUM CHLORIDE 0.9 % IV SOLN: INTRAVENOUS | Status: DC

## 2023-08-06 MED ORDER — FENTANYL CITRATE (PF) 100 MCG/2ML IJ SOLN
INTRAMUSCULAR | Status: AC
Start: 2023-08-06 — End: ?
  Filled 2023-08-06: qty 2

## 2023-08-06 MED ORDER — CHLORHEXIDINE GLUCONATE 4 % EX SOLN
4.0000 | Freq: Once | CUTANEOUS | Status: DC
  Filled 2023-08-06: qty 60

## 2023-08-06 MED ORDER — POVIDONE-IODINE 10 % EX SWAB
2.0000 | Freq: Once | CUTANEOUS | Status: AC
  Administered 2023-08-06: 2 via TOPICAL

## 2023-08-06 MED ORDER — LIDOCAINE HCL (PF) 1 % IJ SOLN
INTRAMUSCULAR | Status: AC
  Filled 2023-08-06: qty 60

## 2023-08-06 MED ORDER — ACETAMINOPHEN 325 MG PO TABS
325.0000 mg | ORAL_TABLET | ORAL | Status: DC | PRN
  Administered 2023-08-06: 650 mg via ORAL
  Filled 2023-08-06: qty 2

## 2023-08-06 MED ORDER — SODIUM CHLORIDE 0.9 % IV SOLN
80.0000 mg | INTRAVENOUS | Status: AC
  Administered 2023-08-06: 80 mg

## 2023-08-06 MED ORDER — ONDANSETRON HCL 4 MG/2ML IJ SOLN: 4.0000 mg | Freq: Four times a day (QID) | INTRAMUSCULAR | Status: DC | PRN

## 2023-08-06 SURGICAL SUPPLY — 12 items
CABLE SURGICAL S-101-97-12 (CABLE) ×1 IMPLANT
KIT ACCESSORY SELECTRA FIX CVD (MISCELLANEOUS) IMPLANT
LEAD SELECTRA 3D-55-42 (CATHETERS) IMPLANT
LEAD SOLIA S PRO MRI 53 (Lead) IMPLANT
LEAD SOLIA S PRO MRI 60 (Lead) IMPLANT
PACEMAKER AMVIA DR-T 460163 (Pacemaker) IMPLANT
PAD DEFIB RADIO PHYSIO CONN (PAD) ×1 IMPLANT
SHEATH 7FR PRELUDE SNAP 13 (SHEATH) IMPLANT
SHEATH 9FR PRELUDE SNAP 13 (SHEATH) IMPLANT
TRAY PACEMAKER INSERTION (PACKS) ×1 IMPLANT
WIRE HI TORQ VERSACORE-J 145CM (WIRE) IMPLANT
WIRE MICRO SET SILHO 5FR 7 (SHEATH) IMPLANT

## 2023-08-06 NOTE — Discharge Instructions (Signed)
 After Your Pacemaker   You have a Biotronik Pacemaker  ACTIVITY Do not lift your arm above shoulder height for 1 week after your procedure. After 7 days, you may progress as below.  You should remove your sling 24 hours after your procedure, unless otherwise instructed by your provider.     Thursday August 13, 2023  Friday August 14, 2023 Saturday August 15, 2023 Sunday August 16, 2023   Do not lift, push, pull, or carry anything over 10 pounds with the affected arm until 6 weeks (Thursday September 17, 2023 ) after your procedure.   You may drive AFTER your wound check, unless you have been told otherwise by your provider.   Ask your healthcare provider when you can go back to work   INCISION/Dressing If you are on a blood thinner such as Coumadin, Xarelto, Eliquis, Plavix, or Pradaxa please confirm with your provider when this should be resumed.   If large square, outer bandage is left in place, this can be removed after 24 hours from your procedure. Do not remove steri-strips or glue as below.   If a PRESSURE DRESSING (a bulky dressing that usually goes up over your shoulder) was applied or left in place, please follow instructions given by your provider on when to return to have this removed.   Monitor your Pacemaker site for redness, swelling, and drainage. Call the device clinic at [phone] if you experience these symptoms or fever/chills.  If your incision is sealed with Steri-strips or staples, you may shower 7 days after your procedure or when told by your provider. Do not remove the steri-strips or let the shower hit directly on your site. You may wash around your site with soap and water .    If you were discharged in a sling, please do not wear this during the day more than 48 hours after your surgery unless otherwise instructed. This may increase the risk of stiffness and soreness in your shoulder.   Avoid lotions, ointments, or perfumes over your incision until it is  well-healed.  You may use a hot tub or a pool AFTER your wound check appointment if the incision is completely closed.  Pacemaker Alerts:  Some alerts are vibratory and others beep. These are NOT emergencies. Please call our office to let us  know. If this occurs at night or on weekends, it can wait until the next business day. Send a remote transmission.  If your device is capable of reading fluid status (for heart failure), you will be offered monthly monitoring to review this with you.   DEVICE MANAGEMENT Remote monitoring is used to monitor your pacemaker from home. This monitoring is scheduled every 91 days by our office. It allows us  to keep an eye on the functioning of your device to ensure it is working properly. You will routinely see your Electrophysiologist annually (more often if necessary).   You should receive your ID card for your new device in 4-8 weeks. Keep this card with you at all times once received. Consider wearing a medical alert bracelet or necklace.  Your Pacemaker may be MRI compatible. This will be discussed at your next office visit/wound check.  You should avoid contact with strong electric or magnetic fields.   Do not use amateur (ham) radio equipment or electric (arc) welding torches. MP3 player headphones with magnets should not be used. Some devices are safe to use if held at least 12 inches (30 cm) from your Pacemaker. These include power tools, lawn  mowers, and speakers. If you are unsure if something is safe to use, ask your health care provider.  When using your cell phone, hold it to the ear that is on the opposite side from the Pacemaker. Do not leave your cell phone in a pocket over the Pacemaker.  You may safely use electric blankets, heating pads, computers, and microwave ovens.  Call the office right away if: You have chest pain. You feel more short of breath than you have felt before. You feel more light-headed than you have felt before. Your  incision starts to open up.  This information is not intended to replace advice given to you by your health care provider. Make sure you discuss any questions you have with your health care provider.

## 2023-08-06 NOTE — Interval H&P Note (Signed)
 History and Physical Interval Note:  08/06/2023 1:50 PM  Margaret [name]  has presented today for surgery, with the diagnosis of symptomatic bradycardia, sinus node dysfunction, and sinus pauses.  The various methods of treatment have been discussed with the patient and family. After consideration of risks, benefits and other options for treatment, the patient has consented to  Procedure(s): PACEMAKER IMPLANT (N/A) as a surgical intervention.  The patient's history has been reviewed, patient examined, no change in status, stable for surgery.  I have reviewed the patient's chart and labs.  Questions were answered to the patient's satisfaction.     Ardeen Kohler

## 2023-08-07 MED FILL — Midazolam HCl Inj 2 MG/2ML (Base Equivalent): INTRAMUSCULAR | Qty: 4 | Status: AC

## 2023-08-20 ENCOUNTER — Telehealth: Payer: Self-pay | Admitting: Cardiology

## 2023-08-20 ENCOUNTER — Ambulatory Visit: Attending: Cardiology

## 2023-08-20 DIAGNOSIS — I495 Sick sinus syndrome: Secondary | ICD-10-CM

## 2023-08-20 NOTE — Telephone Encounter (Signed)
 Patient was seen today and given the okay to drive on her AVS.  Unable to reach pt or leave a message

## 2023-08-20 NOTE — Progress Notes (Signed)
 Normal DUAL chamber pacemaker wound check. Presenting rhythm: AS/VS 76. Wound well healed. Routine testing performed. Thresholds, sensing, and impedances consistent with implant measurements with auto capture on until 3 month visit. No episodes. Reviewed arm restrictions to continue for 6 weeks total post op.  Pt enrolled in remote follow-up.

## 2023-08-20 NOTE — Patient Instructions (Signed)
   After Your Pacemaker   Monitor your pacemaker site for redness, swelling, and drainage. Call the device clinic at [phone] if you experience these symptoms or fever/chills.  Your incision was closed with Steri-strips or staples:  You may shower 7 days after your procedure and wash your incision with soap and water . Avoid lotions, ointments, or perfumes over your incision until it is well-healed.  You may use a hot tub or a pool after your wound check appointment if the incision is completely closed.  Do not lift, push or pull greater than 10 pounds with the affected arm until 6 weeks after your procedure. UNTIL AFTER JULY 10TH.  There are no other restrictions in arm movement after your wound check appointment.  You may drive, unless driving has been restricted by your healthcare providers.  Remote monitoring is used to monitor your pacemaker from home. This monitoring is scheduled every 91 days by our office. It allows us  to keep an eye on the functioning of your device to ensure it is working properly. You will routinely see your Electrophysiologist annually (more often if necessary).

## 2023-08-20 NOTE — Telephone Encounter (Signed)
 Patient calling to see if you she can start back driving. Please advise

## 2023-08-21 ENCOUNTER — Encounter: Payer: Self-pay | Admitting: *Deleted

## 2023-08-21 NOTE — Telephone Encounter (Signed)
 Spoke with pt, she is clear to drive.

## 2023-09-21 ENCOUNTER — Ambulatory Visit

## 2023-09-21 DIAGNOSIS — H524 Presbyopia: Secondary | ICD-10-CM | POA: Diagnosis not present

## 2023-09-21 DIAGNOSIS — H52223 Regular astigmatism, bilateral: Secondary | ICD-10-CM | POA: Diagnosis not present

## 2023-09-21 DIAGNOSIS — I495 Sick sinus syndrome: Secondary | ICD-10-CM | POA: Diagnosis not present

## 2023-09-21 DIAGNOSIS — H5203 Hypermetropia, bilateral: Secondary | ICD-10-CM | POA: Diagnosis not present

## 2023-09-21 LAB — CUP PACEART REMOTE DEVICE CHECK
Date Time Interrogation Session: 20250714123915
Implantable Lead Connection Status: 753985
Implantable Lead Connection Status: 753985
Implantable Lead Implant Date: 20250529
Implantable Lead Implant Date: 20250529
Implantable Lead Location: 753859
Implantable Lead Location: 753860
Implantable Lead Model: 377
Implantable Lead Model: 377
Implantable Lead Serial Number: 8001867807
Implantable Lead Serial Number: 8001922525
Implantable Pulse Generator Implant Date: 20250529
Pulse Gen Serial Number: 1000533180

## 2023-09-23 DIAGNOSIS — E039 Hypothyroidism, unspecified: Secondary | ICD-10-CM | POA: Diagnosis not present

## 2023-09-23 DIAGNOSIS — Z0001 Encounter for general adult medical examination with abnormal findings: Secondary | ICD-10-CM | POA: Diagnosis not present

## 2023-09-23 DIAGNOSIS — F411 Generalized anxiety disorder: Secondary | ICD-10-CM | POA: Diagnosis not present

## 2023-09-23 DIAGNOSIS — K743 Primary biliary cirrhosis: Secondary | ICD-10-CM | POA: Diagnosis not present

## 2023-09-27 ENCOUNTER — Ambulatory Visit: Payer: Self-pay | Admitting: Cardiology

## 2023-10-12 ENCOUNTER — Telehealth: Admitting: Psychiatry

## 2023-10-26 ENCOUNTER — Other Ambulatory Visit: Payer: Self-pay | Admitting: Internal Medicine

## 2023-10-26 DIAGNOSIS — Z1231 Encounter for screening mammogram for malignant neoplasm of breast: Secondary | ICD-10-CM

## 2023-11-05 DIAGNOSIS — I455 Other specified heart block: Secondary | ICD-10-CM | POA: Insufficient documentation

## 2023-11-05 DIAGNOSIS — Z950 Presence of cardiac pacemaker: Secondary | ICD-10-CM | POA: Insufficient documentation

## 2023-11-05 NOTE — Progress Notes (Unsigned)
 Electrophysiology Clinic Note    Date:  11/06/2023  Patient ID:  Margaret [name], Margaret [name] 30-Apr-1960, MRN [id number] PCP:  Rudolpho Norleen BIRCH, MD  Cardiologist:  None   Electrophysiologist:  Fonda Kitty, MD  Electrophysiology APP:  Vearl Allbaugh, NP     Discussed the use of AI scribe software for clinical note transcription with the patient, who gave verbal consent to proceed.   Patient Profile    Chief Complaint: PPM follow-up  History of Present Illness: Margaret [name] is a 63 y.o. female with PMH notable for SND s/p PPM, seizure disorder; seen today for Fonda Kitty, MD for routine electrophysiology follow-up s/p Pacemaker implant.  She saw Dr. Kitty 07/2023 for eval of her sinus pauses and recommended PPM, which was implanted 08/06/2023.   On follow-up today, she feels very well, has resume her usual activity. She is quite active hiking multiple times per week with husband. She continues to have DOE when she is walking fast uphill for awhile. This is not new or worsening. She denies chest pain, chest pressure, palpitations. No complaints regarding PPM implant site.     Arrhythmia/Device History Bio dual chamber PPM, imp 07/2023; dx SND    ROS:  Please see the history of present illness. All other systems are reviewed and otherwise negative.    Physical Exam    VS:  BP 100/66 (BP Location: Left Arm, Patient Position: Sitting, Cuff Size: Normal)   Pulse 83   Ht 5' 3 (1.6 m)   Wt 135 lb 12.8 oz (61.6 kg)   SpO2 98%   BMI 24.06 kg/m  BMI: Body mass index is 24.06 kg/m.      Wt Readings from Last 3 Encounters:  11/06/23 135 lb 12.8 oz (61.6 kg)  08/06/23 138 lb (62.6 kg)  07/21/23 136 lb 6.4 oz (61.9 kg)     GEN- The patient is well appearing, alert and oriented x 3 today.   Lungs- Clear to ausculation bilaterally, normal work of breathing.  Heart- Regular rate and rhythm, no murmurs, rubs or gallops Extremities- No peripheral edema, warm, dry Skin-   device pocket well-healed, no tethering   Device interrogation done today and reviewed by myself:  Battery 9 years Lead thresholds, impedence, sensing stable  No episodes No changes made today   Studies Reviewed   Previous EP, cardiology notes.    EKG is ordered. Personal review of EKG from today shows:    EKG Interpretation Date/Time:  Friday November 06 2023 14:21:04 EDT Ventricular Rate:  83 PR Interval:  154 QRS Duration:  72 QT Interval:  374 QTC Calculation: 439 R Axis:   70  Text Interpretation: Atrial-paced rhythm Nonspecific ST abnormality Confirmed by Damante Spragg [id number]) on 11/06/2023 2:22:41 PM     TTE, 07/07/2023  1. Left ventricular ejection fraction, by estimation, is 60 to 65%. The left ventricle has normal function. The left ventricle has no regional wall motion abnormalities. Left ventricular diastolic parameters were normal.   2. Right ventricular systolic function is normal. The right ventricular size is normal.   3. The mitral valve is normal in structure. Mild mitral valve regurgitation. No evidence of mitral stenosis.   4. The aortic valve is normal in structure. Aortic valve regurgitation is not visualized. No aortic stenosis is present.   5. The inferior vena cava is normal in size with greater than 50% respiratory variability, suggesting right atrial pressure of 3 mmHg.   TTE, 10/04/2016 - Left ventricle: The  cavity size was normal. Systolic function was normal. The estimated ejection fraction was in the range of 60% to 65%. Wall motion was normal; there were no regional wall motion abnormalities. Left ventricular diastolic function parameters were normal.  - Right ventricle: Systolic function was normal.  - Pulmonary arteries: Systolic pressure was within the normal range.    Assessment and Plan     #) SND s/p PPM Device functioning well, see paceart for details Low VP, high AP Stable lead measurements No further syncopal episodes       Current medicines are reviewed at length with the patient today.   The patient does not have concerns regarding her medicines.  The following changes were made today:  none  Labs/ tests ordered today include:  Orders Placed This Encounter  Procedures   EKG 12-Lead     Disposition: Follow up with Dr. Kennyth or EP APP in 2 years , continue remote monitoring   Signed, Chantal Needle, NP  11/06/23  3:35 PM  Electrophysiology [facility]

## 2023-11-06 ENCOUNTER — Ambulatory Visit: Attending: Cardiology | Admitting: Cardiology

## 2023-11-06 VITALS — BP 100/66 | HR 83 | Ht 63.0 in | Wt 135.8 lb

## 2023-11-06 DIAGNOSIS — I455 Other specified heart block: Secondary | ICD-10-CM | POA: Diagnosis not present

## 2023-11-06 DIAGNOSIS — Z950 Presence of cardiac pacemaker: Secondary | ICD-10-CM

## 2023-11-06 NOTE — Patient Instructions (Signed)
 Medication Instructions:  Your physician recommends that you continue on your current medications as directed. Please refer to the Current Medication list given to you today.   *If you need a refill on your cardiac medications before your next appointment, please call your pharmacy*  Lab Work: No labs ordered today  If you have labs (blood work) drawn today and your tests are completely normal, you will receive your results only by: MyChart Message (if you have MyChart) OR A paper copy in the mail If you have any lab test that is abnormal or we need to change your treatment, we will call you to review the results.  Testing/Procedures: No test ordered today   Follow-Up: At [facility], you and your health needs are our priority.  As part of our continuing mission to provide you with exceptional heart care, our providers are all part of one team.  This team includes your primary Cardiologist (physician) and Advanced Practice Providers or APPs (Physician Assistants and Nurse Practitioners) who all work together to provide you with the care you need, when you need it.  Your next appointment:   2 year(s)  Provider:   Suzann Riddle, NP or Dr Kennyth   We recommend signing up for the patient portal called MyChart.  Sign up information is provided on this After Visit Summary.  MyChart is used to connect with patients for Virtual Visits (Telemedicine).  Patients are able to view lab/test results, encounter notes, upcoming appointments, etc.  Non-urgent messages can be sent to your provider as well.   To learn more about what you can do with MyChart, go to [email or web address].

## 2023-11-17 ENCOUNTER — Encounter: Payer: Self-pay | Admitting: Psychiatry

## 2023-11-17 ENCOUNTER — Telehealth (INDEPENDENT_AMBULATORY_CARE_PROVIDER_SITE_OTHER): Admitting: Psychiatry

## 2023-11-17 DIAGNOSIS — F4010 Social phobia, unspecified: Secondary | ICD-10-CM | POA: Diagnosis not present

## 2023-11-17 DIAGNOSIS — G4701 Insomnia due to medical condition: Secondary | ICD-10-CM | POA: Diagnosis not present

## 2023-11-17 DIAGNOSIS — F411 Generalized anxiety disorder: Secondary | ICD-10-CM | POA: Diagnosis not present

## 2023-11-17 NOTE — Progress Notes (Signed)
 Virtual Visit via Video Note  I connected with Margaret [name] on 11/17/23 at  8:30 AM EDT by a video enabled telemedicine application and verified that I am speaking with the correct person using two identifiers.  Location Provider Location : [facility] Patient Location : Home  Participants: Patient , Provider   I discussed the limitations of evaluation and management by telemedicine and the availability of in person appointments. The patient expressed understanding and agreed to proceed.   I discussed the assessment and treatment plan with the patient. The patient was provided an opportunity to ask questions and all were answered. The patient agreed with the plan and demonstrated an understanding of the instructions.   The patient was advised to call back or seek an in-person evaluation if the symptoms worsen or if the condition fails to improve as anticipated.   BH MD OP Progress Note  11/17/2023 8:56 AM Margaret [name]  MRN:  [id number]  Chief Complaint:  Chief Complaint  Patient presents with   Follow-up   Anxiety   Medication Refill   Discussed the use of AI scribe software for clinical note transcription with the patient, who gave verbal consent to proceed.  History of Present Illness Margaret [name] is a 63 year old Caucasian female who is married, lives in [place] , has a history of GAD, social anxiety disorder, insomnia, seizure disorder, hypothyroidism, history of elevated liver enzymes, primary biliary cholangitis, recent cardiac problems with pacemaker placement, was evaluated by telemedicine today.  Currently, she reports that her anxiety remains well managed. For anxiety attacks, she uses hydroxyzine  10-20 mg as needed but notes infrequent use. With more time at home since retiring, she sometimes looks up information on the internet, which can increase her anxiety, though she otherwise feels okay. After her pacemaker procedure, she took hydroxyzine  to help cope but has not  needed it frequently since. She is not taking Prozac  at this time and has not restarted it.  Regarding sleep, she describes good quality and states that trazodone  works well for her. She takes half of a 50 mg tablet (25 mg) as needed for sleep and reports sleeping through the night. She denies any current issues with insomnia.  She reports a good appetite and maintains regular physical activity, including daily walks and hiking with her husband. She denies any thoughts of harming herself or others.  She is retired and lives at home with her husband and cat. She recently traveled to visit her daughter in [place] .   She reports that she underwent pacemaker placement in May 2025. She continues to follow-up with her cardiologist.     Visit Diagnosis:    ICD-10-CM   1. GAD (generalized anxiety disorder)  F41.1     2. Insomnia due to medical condition  G47.01    Anxiety    3. Social anxiety disorder  F40.10       Past Psychiatric History: I have reviewed past psychiatric history from progress note on 01/14/2018.  Past Medical History: History of seizures Past Medical History:  Diagnosis Date   Anxiety    Epilepsy (HCC)    Hypothyroidism    Ovarian cyst     Past Surgical History:  Procedure Laterality Date   BREAST BIOPSY Left 2012   NEG   BREAST CYST ASPIRATION Left 2011   COLONOSCOPY WITH PROPOFOL  N/A 07/04/2022   Procedure: COLONOSCOPY WITH PROPOFOL ;  Surgeon: Jinny Carmine, MD;  Location: [facility];  Service: Endoscopy;  Laterality: N/A;   OVARY  SURGERY     PACEMAKER IMPLANT N/A 08/06/2023   Procedure: PACEMAKER IMPLANT;  Surgeon: Kennyth Chew, MD;  Location: [facility] INVASIVE CV LAB;  Service: Cardiovascular;  Laterality: N/A;    Family Psychiatric History: I have reviewed family history from progress note on 01/14/2018.  Family History:  Family History  Problem Relation Age of Onset   Diabetes Mother    CAD Father    Breast cancer Neg Hx     Social History:  I have reviewed social history from progress note on 01/14/2018. Social History   Socioeconomic History   Marital status: Married    Spouse name: hubert   Number of children: 2   Years of education: Not on file   Highest education level: High school graduate  Occupational History    Comment: part time  Tobacco Use   Smoking status: Former    Current packs/day: 0.00    Types: Cigarettes    Quit date: 03/06/2011    Years since quitting: 12.7   Smokeless tobacco: Never  Vaping Use   Vaping status: Never Used  Substance and Sexual Activity   Alcohol use: Yes    Alcohol/week: 8.0 standard drinks of alcohol    Types: 4 Glasses of wine, 4 Shots of liquor per week   Drug use: No   Sexual activity: Yes    Birth control/protection: None  Other Topics Concern   Not on file  Social History Narrative   Not on file   Social Drivers of Health   Financial Resource Strain: Low Risk  (09/23/2023)   Received from [facility]   Overall Financial Resource Strain (CARDIA)    Difficulty of Paying Living Expenses: Not hard at all  Food Insecurity: No Food Insecurity (09/23/2023)   Received from [facility]   Hunger Vital Sign    Within the past 12 months, you worried that your food would run out before you got the money to buy more.: Never true    Within the past 12 months, the food you bought just didn't last and you didn't have money to get more.: Never true  Transportation Needs: No Transportation Needs (09/23/2023)   Received from [facility] - Transportation    In the past 12 months, has lack of transportation kept you from medical appointments or from getting medications?: No    Lack of Transportation (Non-Medical): No  Physical Activity: Sufficiently Active (03/05/2017)   Exercise Vital Sign    Days of Exercise per Week: 7 days    Minutes of Exercise per Session: 30 min  Stress: No Stress Concern Present (03/05/2017)    [employer] of Occupational Health - Occupational Stress Questionnaire    Feeling of Stress : Not at all  Social Connections: Moderately Isolated (03/05/2017)   Social Connection and Isolation Panel    Frequency of Communication with Friends and Family: Never    Frequency of Social Gatherings with Friends and Family: Never    Attends Religious Services: Never    Database administrator or Organizations: No    Attends Banker Meetings: Never    Marital Status: Married    Allergies:  Allergies  Allergen Reactions   Keppra  [Levetiracetam ] Other (See Comments)    Ineffective   Lexapro  [Escitalopram  Oxalate] Other (See Comments)    Ineffective    Metabolic Disorder Labs: Lab Results  Component Value Date   HGBA1C 5.4 07/07/2023   MPG 108.28 07/07/2023  No results found for: PROLACTIN Lab Results  Component Value Date   CHOL 209 (H) 07/07/2023   TRIG 59 07/07/2023   HDL 65 07/07/2023   CHOLHDL 3.2 07/07/2023   VLDL 12 07/07/2023   LDLCALC 132 (H) 07/07/2023   Lab Results  Component Value Date   TSH 1.833 07/07/2023   TSH 1.514 10/04/2016    Therapeutic Level Labs: No results found for: LITHIUM Lab Results  Component Value Date   VALPROATE 54 11/18/2017   VALPROATE 49 (L) 05/20/2017   No results found for: CBMZ  Current Medications: Current Outpatient Medications  Medication Sig Dispense Refill   hydrOXYzine  (ATARAX ) 10 MG tablet Take 1-2 tablets (10-20 mg total) by mouth daily as needed for anxiety. 60 tablet 0   levothyroxine  (SYNTHROID ) 88 MCG tablet TAKE 1 TABLET(88 MCG) BY MOUTH EVERY DAY 30 TO 60 MINUTES BEFORE BREAKFAST ON AN EMPTY STOMACH AND WITH A GLASS OF WATER      Multiple Vitamins-Minerals (MULTIVITAMIN WITH MINERALS) tablet Take 1 tablet by mouth daily.     Omega-3 Fatty Acids (FISH OIL) 1000 MG CAPS Take 1,000 mg by mouth daily.     Seladelpar Lysine  (LIVDELZI ) 10 MG CAPS Take 10 mg by mouth daily. 90 capsule 3   traZODone   (DESYREL ) 50 MG tablet TAKE 1/2 TO 1 TABLET(25 TO 50 MG) BY MOUTH AT BEDTIME AS NEEDED FOR SLEEP (Patient taking differently: Take 25 mg by mouth at bedtime.) 90 tablet 1   ursodiol  (ACTIGALL ) 300 MG capsule Take 1 capsule (300 mg total) by mouth 3 (three) times daily. With food 90 capsule 5   No current facility-administered medications for this visit.     Musculoskeletal: Strength & Muscle Tone: UTA Gait & Station: Seated Patient leans: N/A  Psychiatric Specialty Exam: Review of Systems  Psychiatric/Behavioral: Negative.      There were no vitals taken for this visit.There is no height or weight on file to calculate BMI.  General Appearance: Casual  Eye Contact:  Fair  Speech:  Clear and Coherent  Volume:  Normal  Mood:  Euthymic  Affect:  Appropriate  Thought Process:  Goal Directed and Descriptions of Associations: Intact  Orientation:  Full (Time, Place, and Person)  Thought Content: Logical   Suicidal Thoughts:  No  Homicidal Thoughts:  No  Memory:  Immediate;   Fair Recent;   Fair Remote;   Fair  Judgement:  Fair  Insight:  Fair  Psychomotor Activity:  Normal  Concentration:  Concentration: Fair and Attention Span: Fair  Recall:  [employer] of Knowledge: Fair  Language: Fair  Akathisia:  No  Handed:  Right  AIMS (if indicated): not done  Assets:  Manufacturing systems engineer Desire for Improvement Housing Social Support Transportation  ADL's:  Intact  Cognition: WNL  Sleep:  Fair   Screenings: GAD-7    Garment/textile technologist Visit from 05/25/2023 in [facility] Office Visit from 02/04/2022 in [facility] Office Visit from 04/24/2021 in [facility]  Total GAD-7 Score 0 1 2   PHQ2-9    Flowsheet Row Office Visit from 05/25/2023 in [facility] Office Visit from 02/04/2022 in [facility]  [facility] Office Visit from 09/12/2021 in [facility] Office Visit from 04/24/2021 in [facility]  PHQ-2 Total Score 0 0 0 0  PHQ-9 Total Score -- 0 -- --  Flowsheet Row Video Visit from 11/17/2023 in [facility] Admission (Discharged) from 08/06/2023 in [facility] CARDIAC CATH LAB ED to Hosp-Admission (Discharged) from 07/07/2023 in [facility] CARDIAC MED PCU  C-SSRS RISK CATEGORY No Risk No Risk No Risk     Assessment and Plan: Margaret [name] is a 63 year old Caucasian female who has a history of anxiety disorder, seizure disorder, pacemaker placement, hypothyroidism, primary biliary cholangitis was evaluated by telemedicine today.  Discussed assessment and plan as noted below.  Generalized anxiety disorder-stable Social anxiety disorder-stable Currently reports overall anxiety is well-managed.  Currently not taking the fluoxetine  and managing anxiety with hydroxyzine  as needed Continue Hydroxyzine  10 mg - 20 mg as needed for severe anxiety attacks   Insomnia-stable Currently following a good sleep hygiene and reports trazodone  is beneficial. Continue Trazodone  25- 50 mg at bedtime as needed  Follow-up Follow-up in clinic in 6 months or sooner in person.   Consent: Patient/Guardian gives verbal consent for treatment and assignment of benefits for services provided during this visit. Patient/Guardian expressed understanding and agreed to proceed.  This note was generated in part or whole with voice recognition software. Voice recognition is usually quite accurate but there are transcription errors that can and very often do occur. I apologize for any typographical errors that were not detected and corrected.     Waver Dibiasio, MD 11/17/2023, 8:56 AM

## 2023-11-19 ENCOUNTER — Other Ambulatory Visit: Payer: Self-pay

## 2023-11-19 ENCOUNTER — Encounter (HOSPITAL_COMMUNITY): Payer: Self-pay

## 2023-11-19 ENCOUNTER — Emergency Department (HOSPITAL_COMMUNITY)
Admission: EM | Admit: 2023-11-19 | Discharge: 2023-11-19 | Disposition: A | Discharge status: Home / Self Care | Attending: Emergency Medicine | Admitting: Emergency Medicine

## 2023-11-19 DIAGNOSIS — N390 Urinary tract infection, site not specified: Secondary | ICD-10-CM | POA: Diagnosis not present

## 2023-11-19 DIAGNOSIS — E039 Hypothyroidism, unspecified: Secondary | ICD-10-CM | POA: Diagnosis not present

## 2023-11-19 DIAGNOSIS — Z950 Presence of cardiac pacemaker: Secondary | ICD-10-CM | POA: Diagnosis not present

## 2023-11-19 DIAGNOSIS — N3001 Acute cystitis with hematuria: Secondary | ICD-10-CM | POA: Diagnosis not present

## 2023-11-19 DIAGNOSIS — Z79899 Other long term (current) drug therapy: Secondary | ICD-10-CM | POA: Insufficient documentation

## 2023-11-19 DIAGNOSIS — R319 Hematuria, unspecified: Secondary | ICD-10-CM

## 2023-11-19 DIAGNOSIS — R300 Dysuria: Secondary | ICD-10-CM | POA: Diagnosis not present

## 2023-11-19 HISTORY — DX: Primary biliary cirrhosis: K74.3

## 2023-11-19 LAB — URINALYSIS, ROUTINE W REFLEX MICROSCOPIC
Bacteria, UA: NONE SEEN
RBC / HPF: >50 RBC/hpf (ref 0–5)
WBC, UA: >50 WBC/hpf (ref 0–5)

## 2023-11-19 LAB — CBC
HCT: 41.3 % (ref 36.0–46.0)
Hemoglobin: 13.8 g/dL (ref 12.0–15.0)
MCH: 31 pg (ref 26.0–34.0)
MCHC: 33.4 g/dL (ref 30.0–36.0)
MCV: 92.8 fL (ref 80.0–100.0)
Platelets: 255 K/uL (ref 150–400)
RBC: 4.45 MIL/uL (ref 3.87–5.11)
RDW: 13.3 % (ref 11.5–15.5)
WBC: 17.9 K/uL — ABNORMAL HIGH (ref 4.0–10.5)
nRBC: 0 % (ref 0.0–0.2)

## 2023-11-19 MED ORDER — CEPHALEXIN 500 MG PO CAPS
500.0000 mg | ORAL_CAPSULE | Freq: Once | ORAL | Status: AC
  Administered 2023-11-19: 500 mg via ORAL
  Filled 2023-11-19: qty 1

## 2023-11-19 MED ORDER — CEPHALEXIN 500 MG PO CAPS: 500.0000 mg | ORAL_CAPSULE | Freq: Four times a day (QID) | ORAL | 0 refills | Status: AC

## 2023-11-19 NOTE — ED Provider Notes (Signed)
 [place] EMERGENCY DEPARTMENT AT [facility] Provider Note   CSN: [id number] Arrival date & time: 11/19/23  8084     Patient presents with: Hematuria   [name] is a 63 y.o. female.   Patient complains of hematuria.  Patient reports she has had some stinging with urination.  Patient reports today she began passing bright red blood.  Patient states she has had urinary tract infections in the past but never had this much bleeding with an infection.  Patient denies any fever or chills she is not having any nausea or vomiting.  Patient denies any abdominal pain she is not having any flank pain.  Patient has a past medical history of hypothyroidism NSTEMI cardiac pacemaker and seizure disorder.  The history is provided by the patient and the spouse.  Hematuria       Prior to Admission medications   Medication Sig Start Date End Date Taking? Authorizing Provider  cephALEXin  (KEFLEX ) 500 MG capsule Take 1 capsule (500 mg total) by mouth 4 (four) times daily. 11/19/23  Yes Stella Bortle K, PA-C  hydrOXYzine  (ATARAX ) 10 MG tablet Take 1-2 tablets (10-20 mg total) by mouth daily as needed for anxiety. 05/25/23   Eappen, Saramma, MD  levothyroxine  (SYNTHROID ) 88 MCG tablet TAKE 1 TABLET(88 MCG) BY MOUTH EVERY DAY 30 TO 60 MINUTES BEFORE BREAKFAST ON AN EMPTY STOMACH AND WITH A GLASS OF WATER  01/13/22   [provider]  Multiple Vitamins-Minerals (MULTIVITAMIN WITH MINERALS) tablet Take 1 tablet by mouth daily.    [provider]  Omega-3 Fatty Acids (FISH OIL) 1000 MG CAPS Take 1,000 mg by mouth daily.    [provider]  Seladelpar Lysine  (LIVDELZI ) 10 MG CAPS Take 10 mg by mouth daily. 01/20/23   Jinny Carmine, MD  traZODone  (DESYREL ) 50 MG tablet TAKE 1/2 TO 1 TABLET(25 TO 50 MG) BY MOUTH AT BEDTIME AS NEEDED FOR SLEEP Patient taking differently: Take 25 mg by mouth at bedtime. 01/20/23   Eappen, Saramma, MD  ursodiol  (ACTIGALL ) 300 MG capsule Take 1  capsule (300 mg total) by mouth 3 (three) times daily. With food 05/06/23   Jinny Carmine, MD    Allergies: Keppra  [levetiracetam ] and Lexapro  [escitalopram  oxalate]    Review of Systems  Genitourinary:  Positive for hematuria.  All other systems reviewed and are negative.   Updated Vital Signs BP (!) 145/89 (BP Location: Right Arm)   Pulse 92   Temp 98.8 F (37.1 C) (Oral)   Resp 16   Ht 5' 3 (1.6 m)   Wt 61.7 kg   SpO2 99%   BMI 24.09 kg/m   Physical Exam Vitals and nursing note reviewed.  Constitutional:      Appearance: She is well-developed.  HENT:     Head: Normocephalic.  Cardiovascular:     Rate and Rhythm: Normal rate.  Pulmonary:     Effort: Pulmonary effort is normal.  Abdominal:     General: There is no distension.  Musculoskeletal:        General: Normal range of motion.     Cervical back: Normal range of motion.  Skin:    General: Skin is warm.  Neurological:     General: No focal deficit present.     Mental Status: She is alert and oriented to person, place, and time.  Psychiatric:        Mood and Affect: Mood normal.     (all labs ordered are listed, but only abnormal results  are displayed) Labs Reviewed  URINALYSIS, ROUTINE W REFLEX MICROSCOPIC - Abnormal; Notable for the following components:      Result Value   Color, Urine RED (*)    APPearance TURBID (*)    Glucose, UA   (*)    Value: TEST NOT REPORTED DUE TO COLOR INTERFERENCE OF URINE PIGMENT   Hgb urine dipstick   (*)    Value: TEST NOT REPORTED DUE TO COLOR INTERFERENCE OF URINE PIGMENT   Bilirubin Urine   (*)    Value: TEST NOT REPORTED DUE TO COLOR INTERFERENCE OF URINE PIGMENT   Ketones, ur   (*)    Value: TEST NOT REPORTED DUE TO COLOR INTERFERENCE OF URINE PIGMENT   Protein, ur   (*)    Value: TEST NOT REPORTED DUE TO COLOR INTERFERENCE OF URINE PIGMENT   Nitrite   (*)    Value: TEST NOT REPORTED DUE TO COLOR INTERFERENCE OF URINE PIGMENT   Leukocytes,Ua   (*)    Value:  TEST NOT REPORTED DUE TO COLOR INTERFERENCE OF URINE PIGMENT   All other components within normal limits  CBC - Abnormal; Notable for the following components:   WBC 17.9 (*)    All other components within normal limits    EKG: None  Radiology: No results found.   Procedures   Medications Ordered in the ED  cephALEXin  (KEFLEX ) capsule 500 mg (500 mg Oral Given 11/19/23 2216)                                    Medical Decision Making Patient complains of blood in her urine.  Patient planes of discomfort with urination  Amount and/or Complexity of Data Reviewed Labs: ordered. Decision-making details documented in ED Course.    Details: UA shows greater than 50 red blood cells and greater than 50 white blood cells.  Risk Prescription drug management. Risk Details: Patient counseled on UTI with hematuria.  Patient is given a dose of Keflex  here.  Urine culture is ordered.  Patient is prescribed Keflex .  Patient is advised to schedule follow-up with urology.  Patient is discharged in stable condition        Final diagnoses:  Hematuria, unspecified type  Urinary tract infection with hematuria, site unspecified    ED Discharge Orders          Ordered    cephALEXin  (KEFLEX ) 500 MG capsule  4 times daily        11/19/23 2208            An After Visit Summary was printed and given to the patient.    Ashauna Bertholf K, PA-C 11/19/23 2223    Suzette Pac, MD 11/21/23 1113

## 2023-11-19 NOTE — Discharge Instructions (Signed)
 Take antibiotic as directed.

## 2023-11-19 NOTE — ED Triage Notes (Signed)
 Patient from home for blood in urination, urinary urgency and frequency, and painful urination that started today. Upon arrival to ER, patient is alert and oriented, ambu

## 2023-11-23 ENCOUNTER — Encounter: Payer: Self-pay | Admitting: Physician Assistant

## 2023-11-23 ENCOUNTER — Telehealth: Payer: Self-pay

## 2023-11-23 ENCOUNTER — Ambulatory Visit: Admitting: Physician Assistant

## 2023-11-23 VITALS — BP 143/82 | HR 121 | Ht 63.0 in | Wt 135.0 lb

## 2023-11-23 DIAGNOSIS — R310 Gross hematuria: Secondary | ICD-10-CM | POA: Diagnosis not present

## 2023-11-23 LAB — MICROSCOPIC EXAMINATION

## 2023-11-23 LAB — URINALYSIS, COMPLETE
Bilirubin, UA: NEGATIVE
Glucose, UA: NEGATIVE
Ketones, UA: NEGATIVE
Nitrite, UA: NEGATIVE
Protein,UA: NEGATIVE
Specific Gravity, UA: 1.025 (ref 1.005–1.030)
Urobilinogen, Ur: 0.2 mg/dL (ref 0.2–1.0)
pH, UA: 6 (ref 5.0–7.5)

## 2023-11-23 NOTE — Progress Notes (Signed)
 11/23/2023 1:05 PM   Margaret [name] [birth year] [id number]  CC: Chief Complaint  Patient presents with   Hematuria    gross   HPI: Margaret [name] is a 62 y.o. female with PMH NSTEMI and epilepsy who presents today as a new patient for evaluation of gross hematuria.   She was seen in the [facility] emergency department 4 days ago with reports of gross hematuria, urgency, frequency, and dysuria.  UA notable for red color, >50 RBC/hpf, > 50 WBC/hpf, no bacteria, and WBC clumps.  She had an elevated white count of 17.9.  No culture was sent or imaging was performed.  She was discharged on empiric Keflex  500 mg every 6 hours x 7 days.  Today she reports her symptoms have largely resolved on empiric Keflex .  She states that she saw urgent care prior to the emergency department, and was notified that a urine culture with them grew E. coli.  She is very concerned today about possible bladder cancer.  She denies flank pain, no personal history of kidney stones.  This is her first UTI since 2019.  She has a remote <10 pack-year smoking history.  No known occupational exposures.  In-office UA today positive for 1+ blood and 1+ leukocytes; urine microscopy with 11-30 WBCs/HPF, 3-10 RBCs/HPF, and moderate bacteria.   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[facility])     Surgical History: Past Surgical History:  Procedure Laterality Date   BREAST BIOPSY Left 2012   NEG   BREAST CYST ASPIRATION Left 2011   COLONOSCOPY WITH PROPOFOL  N/A 07/04/2022   Procedure: COLONOSCOPY WITH PROPOFOL ;  Surgeon: Jinny Carmine, MD;  Location: [facility];  Service: Endoscopy;  Laterality: N/A;   OVARY SURGERY     PACEMAKER IMPLANT N/A 08/06/2023   Procedure: PACEMAKER IMPLANT;  Surgeon: Kennyth Chew, MD;  Location: [facility] INVASIVE CV LAB;  Service: Cardiovascular;  Laterality: N/A;    Home Medications:   Allergies as of 11/23/2023       Reactions   Keppra  [levetiracetam ] Other (See Comments)   Ineffective   Lexapro  [escitalopram  Oxalate] Other (See Comments)   Ineffective        Medication List        Accurate as of November 23, 2023  1:05 PM. If you have any questions, ask your nurse or doctor.          cephALEXin  500 MG capsule Commonly known as: KEFLEX  Take 1 capsule (500 mg total) by mouth 4 (four) times daily.   Fish Oil 1000 MG Caps Take 1,000 mg by mouth daily.   hydrOXYzine  10 MG tablet Commonly known as: ATARAX  Take 1-2 tablets (10-20 mg total) by mouth daily as needed for anxiety.   levothyroxine  88 MCG tablet Commonly known as: SYNTHROID  Take 88 mcg by mouth. What changed: Another medication with the same name was removed. Continue taking this medication, and follow the directions you see here. Changed by: Mylasia Vorhees   Livdelzi  10 MG Caps Generic drug: Seladelpar Lysine  Take 10 mg by mouth daily.   multivitamin with minerals tablet Take 1 tablet by mouth daily.   traZODone  50 MG tablet Commonly known as: DESYREL  TAKE 1/2 TO 1 TABLET(25 TO 50 MG) BY MOUTH AT BEDTIME AS NEEDED FOR SLEEP What changed: See the new instructions.   ursodiol  300 MG capsule Commonly known as:  ACTIGALL  Take 1 capsule (300 mg total) by mouth 3 (three) times daily. With food        Allergies:  Allergies  Allergen Reactions   Keppra  [Levetiracetam ] Other (See Comments)    Ineffective   Lexapro  [Escitalopram  Oxalate] Other (See Comments)    Ineffective    Family History: Family History  Problem Relation Age of Onset   Diabetes Mother    CAD Father    Breast cancer Neg Hx     Social History:   reports that she quit smoking about 12 years ago. Her smoking use included cigarettes. She has never used smokeless tobacco. She reports current alcohol use of about 8.0 standard drinks of alcohol per week. She reports that she does not use drugs.  Physical  Exam: BP (!) 143/82 (BP Location: Left Arm, Patient Position: Sitting)   Pulse (!) 121   Ht 5' 3 (1.6 m)   Wt 135 lb (61.2 kg)   BMI 23.91 kg/m   Constitutional:  Alert and oriented, no acute distress, nontoxic appearing HEENT: [place], AT Cardiovascular: No clubbing, cyanosis, or edema Respiratory: Normal respiratory effort, no increased work of breathing Skin: No rashes, bruises or suspicious lesions Neurologic: Grossly intact, no focal deficits, moving all 4 extremities Psychiatric: Normal mood and affect  Laboratory Data: Results for orders placed or performed in visit on 11/23/23  Microscopic Examination   Collection Time: 11/23/23  1:04 PM   Urine  Result Value Ref Range   WBC, UA 11-30 (A) 0 - 5 /hpf   RBC, Urine 3-10 (A) 0 - 2 /hpf   Epithelial Cells (non renal) 0-10 0 - 10 /hpf   Bacteria, UA Moderate (A) None seen/Few  Urinalysis, Complete   Collection Time: 11/23/23  1:04 PM  Result Value Ref Range   Specific Gravity, UA 1.025 1.005 - 1.030   pH, UA 6.0 5.0 - 7.5   Color, UA Yellow Yellow   Appearance Ur Clear Clear   Leukocytes,UA 1+ (A) Negative   Protein,UA Negative Negative/Trace   Glucose, UA Negative Negative   Ketones, UA Negative Negative   RBC, UA 1+ (A) Negative   Bilirubin, UA Negative Negative   Urobilinogen, Ur 0.2 0.2 - 1.0 mg/dL   Nitrite, UA Negative Negative   Microscopic Examination See below:    Assessment & Plan:   1. Gross hematuria (Primary) I had a lengthy conversation with the patient today.  I think hemorrhagic cystitis is much more consistent with her current presentation than bladder cancer.  We discussed that she had a recent positive urine culture and her UA is improving on antibiotics, and this pattern is not indicative of bladder cancer.  That said, I recommended a repeat UA in 2 weeks.  If she has persistent hematuria at this time, we will pursue hematuria workup.  She was initially concerned and hoping for additional imaging today,  however I explained that any inflammatory stranding around the bladder from cystitis could obscure her imaging results and I recommend that we wait until she completes antibiotics before pursuing imaging, if at all.  She ultimately agreed.  We also discussed that since she is already on antibiotics, her culture today may be negative, and that means that she is on culture appropriate therapy.  I already suspect that this is the case, given the relative improvement in her UA. - Urinalysis, Complete - CULTURE, URINE COMPREHENSIVE   Return in about 2 weeks (around 12/07/2023) for Lab visit for UA.  Nareg Breighner, PA-C  [facility] [address] [place], [place] [address] [phone]

## 2023-11-23 NOTE — Telephone Encounter (Signed)
 Pt called in. I instructed her that I could not give her medical advice due to her not being a current established pt yet. She has appt next with with Sam as a F/U from ED for hematuria. Pt currently taking keflex . Pt was crying and upset and asking for a sooner appt but I explained we are booked out for over a month and that her appt next week is a good time frame. I instructed to her if she had other issues or symptoms she needs to be seen in urgent care or thee ED. Pt was not agreeable.

## 2023-11-27 ENCOUNTER — Ambulatory Visit: Payer: Self-pay | Admitting: Physician Assistant

## 2023-11-27 DIAGNOSIS — Z1331 Encounter for screening for depression: Secondary | ICD-10-CM | POA: Diagnosis not present

## 2023-11-27 DIAGNOSIS — Z01411 Encounter for gynecological examination (general) (routine) with abnormal findings: Secondary | ICD-10-CM | POA: Diagnosis not present

## 2023-11-27 LAB — CULTURE, URINE COMPREHENSIVE

## 2023-11-30 ENCOUNTER — Other Ambulatory Visit: Payer: Self-pay

## 2023-11-30 DIAGNOSIS — R310 Gross hematuria: Secondary | ICD-10-CM

## 2023-12-01 ENCOUNTER — Other Ambulatory Visit: Payer: Self-pay | Admitting: Gastroenterology

## 2023-12-02 ENCOUNTER — Ambulatory Visit: Admitting: Physician Assistant

## 2023-12-07 ENCOUNTER — Other Ambulatory Visit

## 2023-12-07 DIAGNOSIS — R310 Gross hematuria: Secondary | ICD-10-CM

## 2023-12-07 LAB — URINALYSIS, COMPLETE
Bilirubin, UA: NEGATIVE
Glucose, UA: NEGATIVE
Ketones, UA: NEGATIVE
Nitrite, UA: NEGATIVE
Protein,UA: NEGATIVE
Specific Gravity, UA: 1.01 (ref 1.005–1.030)
Urobilinogen, Ur: 0.2 mg/dL (ref 0.2–1.0)
pH, UA: 6 (ref 5.0–7.5)

## 2023-12-07 LAB — MICROSCOPIC EXAMINATION

## 2023-12-10 ENCOUNTER — Ambulatory Visit: Payer: Self-pay | Admitting: Physician Assistant

## 2023-12-10 DIAGNOSIS — R310 Gross hematuria: Secondary | ICD-10-CM

## 2023-12-11 NOTE — Telephone Encounter (Signed)
 Yes, please schedule her with Dr. Georganne in 4 weeks for cysto with CTU prior. I'll put in the CT order.

## 2023-12-15 ENCOUNTER — Ambulatory Visit
Admission: RE | Admit: 2023-12-15 | Discharge: 2023-12-15 | Disposition: A | Discharge status: Home / Self Care | Source: Ambulatory Visit | Attending: Physician Assistant | Admitting: Physician Assistant

## 2023-12-15 DIAGNOSIS — R310 Gross hematuria: Secondary | ICD-10-CM | POA: Diagnosis not present

## 2023-12-15 DIAGNOSIS — K7689 Other specified diseases of liver: Secondary | ICD-10-CM | POA: Diagnosis not present

## 2023-12-15 DIAGNOSIS — K429 Umbilical hernia without obstruction or gangrene: Secondary | ICD-10-CM | POA: Diagnosis not present

## 2023-12-15 MED ORDER — SODIUM CHLORIDE 0.9 % IV SOLN: INTRAVENOUS | Status: DC

## 2023-12-15 MED ORDER — IOHEXOL 300 MG/ML  SOLN
100.0000 mL | Freq: Once | INTRAMUSCULAR | Status: AC | PRN
  Administered 2023-12-15: 100 mL via INTRAVENOUS

## 2023-12-17 NOTE — Progress Notes (Signed)
 Remote PPM Transmission

## 2023-12-21 ENCOUNTER — Ambulatory Visit: Payer: Self-pay | Admitting: Oncology

## 2023-12-21 ENCOUNTER — Ambulatory Visit (INDEPENDENT_AMBULATORY_CARE_PROVIDER_SITE_OTHER)

## 2023-12-21 DIAGNOSIS — I455 Other specified heart block: Secondary | ICD-10-CM | POA: Diagnosis not present

## 2023-12-21 LAB — CUP PACEART REMOTE DEVICE CHECK
Date Time Interrogation Session: 20251013101622
Implantable Lead Connection Status: 753985
Implantable Lead Connection Status: 753985
Implantable Lead Implant Date: 20250529
Implantable Lead Implant Date: 20250529
Implantable Lead Location: 753859
Implantable Lead Location: 753860
Implantable Lead Model: 377
Implantable Lead Model: 377
Implantable Lead Serial Number: 8001867807
Implantable Lead Serial Number: 8001922525
Implantable Pulse Generator Implant Date: 20250529
Pulse Gen Serial Number: 1000533180

## 2023-12-22 NOTE — Progress Notes (Signed)
 Remote PPM Transmission

## 2024-01-02 ENCOUNTER — Ambulatory Visit: Payer: Self-pay | Admitting: Cardiology

## 2024-01-08 DIAGNOSIS — D2262 Melanocytic nevi of left upper limb, including shoulder: Secondary | ICD-10-CM | POA: Diagnosis not present

## 2024-01-08 DIAGNOSIS — D2272 Melanocytic nevi of left lower limb, including hip: Secondary | ICD-10-CM | POA: Diagnosis not present

## 2024-01-08 DIAGNOSIS — D2261 Melanocytic nevi of right upper limb, including shoulder: Secondary | ICD-10-CM | POA: Diagnosis not present

## 2024-01-08 DIAGNOSIS — D225 Melanocytic nevi of trunk: Secondary | ICD-10-CM | POA: Diagnosis not present

## 2024-01-11 ENCOUNTER — Other Ambulatory Visit: Admitting: Urology

## 2024-01-12 DIAGNOSIS — B159 Hepatitis A without hepatic coma: Secondary | ICD-10-CM | POA: Diagnosis not present

## 2024-01-12 DIAGNOSIS — Z780 Asymptomatic menopausal state: Secondary | ICD-10-CM | POA: Diagnosis not present

## 2024-01-12 DIAGNOSIS — Z87891 Personal history of nicotine dependence: Secondary | ICD-10-CM | POA: Diagnosis not present

## 2024-01-12 DIAGNOSIS — Z23 Encounter for immunization: Secondary | ICD-10-CM | POA: Diagnosis not present

## 2024-01-12 DIAGNOSIS — K743 Primary biliary cirrhosis: Secondary | ICD-10-CM | POA: Diagnosis not present

## 2024-01-12 DIAGNOSIS — Z13820 Encounter for screening for osteoporosis: Secondary | ICD-10-CM | POA: Diagnosis not present

## 2024-01-14 ENCOUNTER — Ambulatory Visit
Admission: RE | Admit: 2024-01-14 | Discharge: 2024-01-14 | Disposition: A | Discharge status: Home / Self Care | Source: Ambulatory Visit | Attending: Internal Medicine | Admitting: Internal Medicine

## 2024-01-14 DIAGNOSIS — Z1231 Encounter for screening mammogram for malignant neoplasm of breast: Secondary | ICD-10-CM | POA: Diagnosis not present

## 2024-01-21 NOTE — Progress Notes (Signed)
   01/25/2024 8:32 AM   Margaret [name] Shove June 16, 1960 [id number]  Cystoscopy Procedure Note:  Indication:  Acute GH with UTI Patient worried about bladder Ca <10-pack year former smoker -Fhx GU malignancies  After informed consent and discussion of the procedure and its risks, JATZIRI GOFFREDO was positioned and prepped in the standard fashion. Cystoscopy was performed with a flexible cystoscope. The external vaginal anatomy, urethra, bladder neck and bladder mucosa were visualized in a systematic fashion. The ureteral orifices were noted in orthotopic location and orientation. There were no bladder mucosal lesions, stones, debris or anatomic variants noted.   Imaging: Recent CTU  (12/16/23) reviewed - no GU abnormalities noted  Findings: Normal female cystourethroscopy  Assessment and Plan: -No further workup indicated at this time.  Follow-up as needed  Penne Skye, MD 01/21/2024

## 2024-01-25 ENCOUNTER — Ambulatory Visit: Admitting: Urology

## 2024-01-25 VITALS — BP 138/76 | HR 103 | Ht 64.0 in | Wt 135.0 lb

## 2024-01-25 DIAGNOSIS — R310 Gross hematuria: Secondary | ICD-10-CM

## 2024-01-25 LAB — URINALYSIS, COMPLETE
Bilirubin, UA: NEGATIVE
Glucose, UA: NEGATIVE
Ketones, UA: NEGATIVE
Leukocytes,UA: NEGATIVE
Nitrite, UA: NEGATIVE
Protein,UA: NEGATIVE
Specific Gravity, UA: 1.01 (ref 1.005–1.030)
Urobilinogen, Ur: 0.2 mg/dL (ref 0.2–1.0)
pH, UA: 6 (ref 5.0–7.5)

## 2024-01-25 LAB — MICROSCOPIC EXAMINATION: Bacteria, UA: NONE SEEN

## 2024-02-21 ENCOUNTER — Other Ambulatory Visit: Payer: Self-pay | Admitting: Psychiatry

## 2024-02-21 DIAGNOSIS — F4010 Social phobia, unspecified: Secondary | ICD-10-CM

## 2024-02-21 DIAGNOSIS — F411 Generalized anxiety disorder: Secondary | ICD-10-CM

## 2024-02-22 ENCOUNTER — Other Ambulatory Visit: Payer: Self-pay | Admitting: Psychiatry

## 2024-02-22 DIAGNOSIS — F411 Generalized anxiety disorder: Secondary | ICD-10-CM

## 2024-02-22 DIAGNOSIS — F4010 Social phobia, unspecified: Secondary | ICD-10-CM

## 2024-02-22 MED ORDER — FLUOXETINE HCL 40 MG PO CAPS: 40.0000 mg | ORAL_CAPSULE | Freq: Every day | ORAL | 1 refills | Status: DC

## 2024-02-23 MED ORDER — FLUOXETINE HCL 20 MG PO CAPS: 20.0000 mg | ORAL_CAPSULE | Freq: Every day | ORAL | 3 refills | Status: AC

## 2024-02-23 NOTE — Addendum Note (Signed)
 Addended byBETHA COBY HEIGHT on: 02/23/2024 11:00 AM   Modules accepted: Orders

## 2024-03-21 ENCOUNTER — Ambulatory Visit

## 2024-03-21 DIAGNOSIS — I495 Sick sinus syndrome: Secondary | ICD-10-CM

## 2024-03-22 LAB — CUP PACEART REMOTE DEVICE CHECK
Date Time Interrogation Session: 20260112095031
Implantable Lead Connection Status: 753985
Implantable Lead Connection Status: 753985
Implantable Lead Implant Date: 20250529
Implantable Lead Implant Date: 20250529
Implantable Lead Location: 753859
Implantable Lead Location: 753860
Implantable Lead Model: 377
Implantable Lead Model: 377
Implantable Lead Serial Number: 8001867807
Implantable Lead Serial Number: 8001922525
Implantable Pulse Generator Implant Date: 20250529
Pulse Gen Serial Number: 1000533180

## 2024-03-23 NOTE — Progress Notes (Signed)
 Remote PPM Transmission

## 2024-03-27 ENCOUNTER — Ambulatory Visit: Payer: Self-pay | Admitting: Cardiology

## 2024-04-14 DIAGNOSIS — G4701 Insomnia due to medical condition: Secondary | ICD-10-CM

## 2024-04-14 MED ORDER — TRAZODONE HCL 50 MG PO TABS: 25.0000 mg | ORAL_TABLET | Freq: Every evening | ORAL | 1 refills | Status: AC | PRN

## 2024-05-12 ENCOUNTER — Ambulatory Visit: Admitting: Psychiatry

## 2024-06-20 ENCOUNTER — Encounter

## 2024-09-19 ENCOUNTER — Encounter

## 2024-12-19 ENCOUNTER — Encounter

## 2025-03-20 ENCOUNTER — Encounter

## 2025-06-19 ENCOUNTER — Encounter
